# Patient Record
Sex: Female | Born: 1950
Health system: Southern US, Community
[De-identification: ages and names within clinical notes are randomized; demographics above are authoritative.]

## PROBLEM LIST (undated history)

## (undated) DIAGNOSIS — R569 Unspecified convulsions: Secondary | ICD-10-CM

## (undated) DIAGNOSIS — T4145XA Adverse effect of unspecified anesthetic, initial encounter: Secondary | ICD-10-CM

## (undated) DIAGNOSIS — M1990 Unspecified osteoarthritis, unspecified site: Secondary | ICD-10-CM

## (undated) DIAGNOSIS — G44209 Tension-type headache, unspecified, not intractable: Secondary | ICD-10-CM

## (undated) DIAGNOSIS — Z800 Family history of malignant neoplasm of digestive organs: Secondary | ICD-10-CM

## (undated) DIAGNOSIS — T8859XA Other complications of anesthesia, initial encounter: Secondary | ICD-10-CM

## (undated) DIAGNOSIS — Z8489 Family history of other specified conditions: Secondary | ICD-10-CM

## (undated) HISTORY — DX: Family history of malignant neoplasm of digestive organs: Z80.0

## (undated) HISTORY — DX: Tension-type headache, unspecified, not intractable: G44.209

## (undated) HISTORY — PX: COLONOSCOPY: SHX174

## (undated) HISTORY — PX: OTHER SURGICAL HISTORY: SHX169

---

## 1989-02-04 HISTORY — PX: ABDOMINAL HYSTERECTOMY: SHX81

## 2003-01-21 ENCOUNTER — Ambulatory Visit (HOSPITAL_COMMUNITY): Admission: RE | Admit: 2003-01-21 | Discharge: 2003-01-21 | Payer: Self-pay | Admitting: *Deleted

## 2008-02-29 ENCOUNTER — Encounter: Admission: RE | Admit: 2008-02-29 | Discharge: 2008-05-29 | Payer: Self-pay | Admitting: Internal Medicine

## 2010-06-22 NOTE — Op Note (Signed)
Ashlee Beasley, Ashlee Beasley                         ACCOUNT NO.:  [id number]   MEDICAL RECORD NO.:  [id number]                   PATIENT TYPE:  AMB   LOCATION:  ENDO                                 FACILITY:  [facility]   PHYSICIAN:  Georgiana Spinner, M.D.                 DATE OF BIRTH:  1950/02/26   DATE OF PROCEDURE:  01/21/2003  DATE OF DISCHARGE:                                 OPERATIVE REPORT   PROCEDURE:  Colonoscopy.   INDICATIONS:  Colon cancer screening.   ANESTHESIA:  1. Demerol 50 mg.  2. Versed 8 mg.   DESCRIPTION OF PROCEDURE:  With the patient mildly sedated in the left  lateral decubitus position, the Olympus videoscopic colonoscope was inserted  in the rectum and passed through a somewhat tortuous sigmoid colon until we  reached the cecum, identified by the ileocecal valve and the appendiceal  orifice, both of which were photographed.  From this point, the colonoscope  was then slowly withdrawn, taking circumferential views of the colonic  mucosa, stopping only then in the rectum which appeared normal on direct and  retroflexed view.  The endoscope was straightened and withdrawn.  The  patient's vital signs and pulse oximeter remained stable.  The patient  tolerated the procedure well without apparent complications.   FINDINGS:  Unremarkable examination.   PLAN:  Repeat examination in possibly five years.                                               Georgiana Spinner, M.D.    GMO/MEDQ  D:  01/21/2003  T:  01/21/2003  Job:  [id number]

## 2013-04-08 NOTE — H&P (Signed)
TOTAL HIP ADMISSION H&P  Patient is admitted for right total hip arthroplasty, anterior approach.  Subjective:  Chief Complaint:    Right hip OA / pain  HPI:      Ashlee Beasley, 63 y.o. female, has a history of pain and functional disability in the right hip(s) due to arthritis and patient has failed non-surgical conservative treatments for greater than 12 weeks to include NSAID's and/or analgesics, corticosteriod injections, use of assistive devices and activity modification. Onset of symptoms was gradual starting 2+ years ago with gradually worsening course since that time.The patient noted past history of SCFE on the right hip(s). Patient currently rates pain in the right hip at 6 out of 10 with activity. Patient has night pain, worsening of pain with activity and weight bearing, trendelenberg gait, pain that interfers with activities of daily living and pain with passive range of motion. Patient has evidence of periarticular osteophytes and joint space narrowing by imaging studies. This condition presents safety issues increasing the risk of falls. There is no current active infection. Risks, benefits and expectations were discussed with the patient. Risks including but not limited to the risk of anesthesia, blood clots, nerve damage, blood vessel damage, failure of the prosthesis, infection and up to and including death. Patient understand the risks, benefits and expectations and wishes to proceed with surgery.   D/C Plans: Home with HHPT   Post-op Meds: No Rx given   Tranexamic Acid: To be given   Decadron: To be given   FYI:  ASA post-op   Norco post-op    Past Medical History  Diagnosis Date  . Seizures 1978 or 1979     had 2 seizures, no meds since 1982  . Complication of anesthesia   . Arthritis     oa  . Family history of anesthesia complication     mother n/v, blood clot x 1  few daysd after surgery    Past Surgical History  Procedure Laterality Date  . Abdominal  hysterectomy  1991  . Colonscopy  over 10 yrs ago     Allergies  Allergen Reactions  . Phenobarbital Rash        History  Substance Use Topics  . Smoking status: Former Smoker -- 0.50 packs/day for 10 years    Types: Cigarettes    Quit date: 02/04/1998  . Smokeless tobacco: Never Used  . Alcohol Use: Yes     Comment: occasional beer     Review of Systems  Constitutional: Negative.   HENT: Negative.   Eyes: Negative.   Respiratory: Negative.   Cardiovascular: Negative.   Gastrointestinal: Negative.   Genitourinary: Negative.   Musculoskeletal: Positive for joint pain.  Skin: Negative.   Neurological: Negative.   Endo/Heme/Allergies: Negative.   Psychiatric/Behavioral: Negative.     Objective:  Physical Exam  Constitutional: She is oriented to person, place, and time. She appears well-developed and well-nourished.  HENT:  Head: Normocephalic and atraumatic.  Mouth/Throat: Oropharynx is clear and moist.  Eyes: Pupils are equal, round, and reactive to light.  Neck: Neck supple. No JVD present. No tracheal deviation present. No thyromegaly present.  Cardiovascular: Normal rate, regular rhythm, normal heart sounds and intact distal pulses.   Respiratory: Effort normal and breath sounds normal. No stridor. No respiratory distress. She has no wheezes.  GI: Soft. There is no tenderness. There is no guarding.  Musculoskeletal:       Right hip: She exhibits decreased range of motion, decreased strength, tenderness and bony  tenderness. She exhibits no swelling, no deformity and no laceration.  Lymphadenopathy:    She has no cervical adenopathy.  Neurological: She is alert and oriented to person, place, and time.  Skin: Skin is warm and dry.  Psychiatric: She has a normal mood and affect.      Imaging Review Plain radiographs demonstrate severe degenerative joint disease of the right hip(s). The bone quality appears to be good for age and reported activity  level.  Assessment/Plan:  End stage arthritis, right hip(s)  The patient history, physical examination, clinical judgement of the provider and imaging studies are consistent with end stage degenerative joint disease of the right hip(s) and total hip arthroplasty is deemed medically necessary. The treatment options including medical management, injection therapy, arthroscopy and arthroplasty were discussed at length. The risks and benefits of total hip arthroplasty were presented and reviewed. The risks due to aseptic loosening, infection, stiffness, dislocation/subluxation,  thromboembolic complications and other imponderables were discussed.  The patient acknowledged the explanation, agreed to proceed with the plan and consent was signed. Patient is being admitted for inpatient treatment for surgery, pain control, PT, OT, prophylactic antibiotics, VTE prophylaxis, progressive ambulation and ADL's and discharge planning.The patient is planning to be discharged home with home health services.     [place] Pugh Kateryn Marasigan   PAC  04/08/2013, 3:08 PM

## 2013-04-13 ENCOUNTER — Other Ambulatory Visit (HOSPITAL_COMMUNITY): Payer: Self-pay | Admitting: Orthopedic Surgery

## 2013-04-13 ENCOUNTER — Encounter (HOSPITAL_COMMUNITY): Payer: Self-pay | Admitting: Pharmacy Technician

## 2013-04-13 NOTE — Progress Notes (Signed)
Medical clearance note dr Loney Loh on chart

## 2013-04-13 NOTE — Patient Instructions (Addendum)
[place]  04/13/2013   Your procedure is scheduled on: Tuesday March 17th  Report to [place] at  515 AM.  Call this number if you have problems the morning of surgery [phone]   Remember: short stay will be drawing your blood type morning of surgery.  Do not eat food or drink liquids :After Midnight.     Take these medicines the morning of surgery with A SIP OF WATER: NO MEDS TO TAKE                                SEE [place] PREPARING FOR SURGERY SHEET             You may not have any metal on your body including hair pins and piercings  Do not wear jewelry, make-up.  Do not wear lotions, powders, or perfumes. No  Deodorant is to be worn.   Men may shave face and neck.  Do not bring valuables to the hospital. [place].  Contacts, dentures or bridgework may not be worn into surgery.  Leave suitcase in the car. After surgery it may be brought to your room.  For patients admitted to the hospital, checkout time is 11:00 AM the day of discharge.    Please read over the following fact sheets that you were given: [facility] Preparing for surgery sheet,  incentive spirometer sheet, blood fact sheet, MRSA information  Call Zelphia Cairo RN pre op nurse if needed [phone]    [place].  PATIENT SIGNATURE___________________________________________  NURSE SIGNATURE_____________________________________________

## 2013-04-15 ENCOUNTER — Encounter (HOSPITAL_COMMUNITY)
Admission: RE | Admit: 2013-04-15 | Discharge: 2013-04-15 | Disposition: A | Discharge status: Home / Self Care | Payer: BC Managed Care – PPO | Source: Ambulatory Visit | Attending: Orthopedic Surgery | Admitting: Orthopedic Surgery

## 2013-04-15 ENCOUNTER — Encounter (INDEPENDENT_AMBULATORY_CARE_PROVIDER_SITE_OTHER): Payer: Self-pay

## 2013-04-15 ENCOUNTER — Encounter (HOSPITAL_COMMUNITY): Payer: Self-pay

## 2013-04-15 DIAGNOSIS — Z01812 Encounter for preprocedural laboratory examination: Secondary | ICD-10-CM | POA: Insufficient documentation

## 2013-04-15 HISTORY — DX: Adverse effect of unspecified anesthetic, initial encounter: T41.45XA

## 2013-04-15 HISTORY — DX: Family history of other specified conditions: Z84.89

## 2013-04-15 HISTORY — DX: Other complications of anesthesia, initial encounter: T88.59XA

## 2013-04-15 HISTORY — DX: Unspecified osteoarthritis, unspecified site: M19.90

## 2013-04-15 HISTORY — DX: Unspecified convulsions: R56.9

## 2013-04-15 LAB — SURGICAL PCR SCREEN
MRSA, PCR: NEGATIVE
Staphylococcus aureus: NEGATIVE

## 2013-04-15 LAB — CBC
HEMATOCRIT: 42.1 % (ref 36.0–46.0)
Hemoglobin: 14.5 g/dL (ref 12.0–15.0)
MCH: 29.8 pg (ref 26.0–34.0)
MCHC: 34.4 g/dL (ref 30.0–36.0)
MCV: 86.6 fL (ref 78.0–100.0)
PLATELETS: 220 10*3/uL (ref 150–400)
RBC: 4.86 MIL/uL (ref 3.87–5.11)
RDW: 12.7 % (ref 11.5–15.5)
WBC: 5.8 10*3/uL (ref 4.0–10.5)

## 2013-04-15 LAB — BASIC METABOLIC PANEL
BUN: 17 mg/dL (ref 6–23)
CHLORIDE: 101 meq/L (ref 96–112)
CO2: 27 mEq/L (ref 19–32)
Calcium: 9.6 mg/dL (ref 8.4–10.5)
Creatinine, Ser: 0.73 mg/dL (ref 0.50–1.10)
GFR calc non Af Amer: >90 mL/min (ref >90)
Glucose, Bld: 95 mg/dL (ref 70–99)
Potassium: 4.5 mEq/L (ref 3.7–5.3)
Sodium: 139 mEq/L (ref 137–147)

## 2013-04-15 LAB — URINALYSIS, ROUTINE W REFLEX MICROSCOPIC
BILIRUBIN URINE: NEGATIVE
Glucose, UA: NEGATIVE mg/dL
KETONES UR: NEGATIVE mg/dL
LEUKOCYTES UA: NEGATIVE
NITRITE: NEGATIVE
PROTEIN: NEGATIVE mg/dL
Specific Gravity, Urine: 1.005 (ref 1.005–1.030)
UROBILINOGEN UA: 0.2 mg/dL (ref 0.0–1.0)
pH: 5.5 (ref 5.0–8.0)

## 2013-04-15 LAB — APTT: APTT: 26 s (ref 24–37)

## 2013-04-15 LAB — URINE MICROSCOPIC-ADD ON

## 2013-04-15 LAB — PROTIME-INR
INR: 0.98 (ref 0.00–1.49)
Prothrombin Time: 12.8 seconds (ref 11.6–15.2)

## 2013-04-19 NOTE — Anesthesia Preprocedure Evaluation (Addendum)
Anesthesia Evaluation  Patient identified by MRN, date of birth, ID band Patient awake    Reviewed: Allergy & Precautions, H&P , NPO status , Patient's Chart, lab work & pertinent test results  Airway Mallampati: II TM Distance: >3 FB Neck ROM: Full    Dental no notable dental hx.    Pulmonary neg pulmonary ROS, former smoker,  breath sounds clear to auscultation  Pulmonary exam normal       Cardiovascular negative cardio ROS  Rhythm:Regular Rate:Normal     Neuro/Psych negative neurological ROS  negative psych ROS   GI/Hepatic negative GI ROS, Neg liver ROS,   Endo/Other  negative endocrine ROS  Renal/GU negative Renal ROS  negative genitourinary   Musculoskeletal negative musculoskeletal ROS (+)   Abdominal   Peds negative pediatric ROS (+)  Hematology negative hematology ROS (+)   Anesthesia Other Findings   Reproductive/Obstetrics negative OB ROS                         Anesthesia Physical Anesthesia Plan  ASA: II  Anesthesia Plan: Spinal   Post-op Pain Management:    Induction: Intravenous  Airway Management Planned: Simple Face Mask  Additional Equipment:   Intra-op Plan:   Post-operative Plan:   Informed Consent: I have reviewed the patients History and Physical, chart, labs and discussed the procedure including the risks, benefits and alternatives for the proposed anesthesia with the patient or authorized representative who has indicated his/her understanding and acceptance.     Plan Discussed with: CRNA and Surgeon  Anesthesia Plan Comments:         Anesthesia Quick Evaluation

## 2013-04-20 ENCOUNTER — Inpatient Hospital Stay (HOSPITAL_COMMUNITY): Payer: BC Managed Care – PPO

## 2013-04-20 ENCOUNTER — Encounter (HOSPITAL_COMMUNITY): Payer: Self-pay

## 2013-04-20 ENCOUNTER — Inpatient Hospital Stay (HOSPITAL_COMMUNITY): Admission: RE | Admit: 2013-04-20 | Payer: Self-pay | Source: Ambulatory Visit | Admitting: Orthopedic Surgery

## 2013-04-20 ENCOUNTER — Encounter (HOSPITAL_COMMUNITY): Admission: RE | Payer: Self-pay | Source: Ambulatory Visit

## 2013-04-20 ENCOUNTER — Encounter (HOSPITAL_COMMUNITY)
Admission: RE | Disposition: A | Discharge status: Home Health | Payer: Self-pay | Source: Ambulatory Visit | Attending: Orthopedic Surgery

## 2013-04-20 ENCOUNTER — Encounter (HOSPITAL_COMMUNITY): Payer: BC Managed Care – PPO | Admitting: Anesthesiology

## 2013-04-20 ENCOUNTER — Inpatient Hospital Stay (HOSPITAL_COMMUNITY)
Admission: RE | Admit: 2013-04-20 | Discharge: 2013-04-21 | DRG: 470 | Disposition: A | Discharge status: Home Health | Payer: BC Managed Care – PPO | Source: Ambulatory Visit | Attending: Orthopedic Surgery | Admitting: Orthopedic Surgery

## 2013-04-20 ENCOUNTER — Inpatient Hospital Stay (HOSPITAL_COMMUNITY): Payer: BC Managed Care – PPO | Admitting: Anesthesiology

## 2013-04-20 DIAGNOSIS — D62 Acute posthemorrhagic anemia: Secondary | ICD-10-CM | POA: Diagnosis not present

## 2013-04-20 DIAGNOSIS — Z87891 Personal history of nicotine dependence: Secondary | ICD-10-CM

## 2013-04-20 DIAGNOSIS — M169 Osteoarthritis of hip, unspecified: Principal | ICD-10-CM | POA: Diagnosis present

## 2013-04-20 DIAGNOSIS — Z96649 Presence of unspecified artificial hip joint: Secondary | ICD-10-CM

## 2013-04-20 DIAGNOSIS — Q6589 Other specified congenital deformities of hip: Secondary | ICD-10-CM

## 2013-04-20 DIAGNOSIS — M1610 Unilateral primary osteoarthritis, unspecified hip: Principal | ICD-10-CM | POA: Diagnosis present

## 2013-04-20 DIAGNOSIS — D500 Iron deficiency anemia secondary to blood loss (chronic): Secondary | ICD-10-CM | POA: Diagnosis not present

## 2013-04-20 HISTORY — PX: TOTAL HIP ARTHROPLASTY: SHX124

## 2013-04-20 LAB — TYPE AND SCREEN
ABO/RH(D): B POS
ANTIBODY SCREEN: NEGATIVE

## 2013-04-20 LAB — ABO/RH: ABO/RH(D): B POS

## 2013-04-20 SURGERY — ARTHROPLASTY, HIP, TOTAL, ANTERIOR APPROACH
Anesthesia: Spinal | Site: Hip | Laterality: Right

## 2013-04-20 MED ORDER — ALUM & MAG HYDROXIDE-SIMETH 200-200-20 MG/5ML PO SUSP: 30.0000 mL | ORAL | Status: DC | PRN

## 2013-04-20 MED ORDER — MIDAZOLAM HCL 5 MG/5ML IJ SOLN
INTRAMUSCULAR | Status: DC | PRN
  Administered 2013-04-20: 2 mg via INTRAVENOUS

## 2013-04-20 MED ORDER — DEXAMETHASONE SODIUM PHOSPHATE 10 MG/ML IJ SOLN
10.0000 mg | Freq: Once | INTRAMUSCULAR | Status: AC
  Administered 2013-04-20: 10 mg via INTRAVENOUS

## 2013-04-20 MED ORDER — POLYETHYLENE GLYCOL 3350 17 G PO PACK: 17.0000 g | PACK | Freq: Every day | ORAL | Status: DC | PRN

## 2013-04-20 MED ORDER — ONDANSETRON HCL 4 MG/2ML IJ SOLN
4.0000 mg | Freq: Four times a day (QID) | INTRAMUSCULAR | Status: DC | PRN
  Administered 2013-04-20 – 2013-04-21 (×2): 4 mg via INTRAVENOUS
  Filled 2013-04-20 (×2): qty 2

## 2013-04-20 MED ORDER — EPHEDRINE SULFATE 50 MG/ML IJ SOLN
INTRAMUSCULAR | Status: DC | PRN
  Administered 2013-04-20 (×3): 5 mg via INTRAVENOUS

## 2013-04-20 MED ORDER — TRANEXAMIC ACID 100 MG/ML IV SOLN
1000.0000 mg | Freq: Once | INTRAVENOUS | Status: AC
  Administered 2013-04-20: 1000 mg via INTRAVENOUS
  Filled 2013-04-20: qty 10

## 2013-04-20 MED ORDER — FERROUS SULFATE 325 (65 FE) MG PO TABS
325.0000 mg | ORAL_TABLET | Freq: Three times a day (TID) | ORAL | Status: DC
  Administered 2013-04-20 – 2013-04-21 (×2): 325 mg via ORAL
  Filled 2013-04-20 (×5): qty 1

## 2013-04-20 MED ORDER — CEFAZOLIN SODIUM-DEXTROSE 2-3 GM-% IV SOLR
INTRAVENOUS | Status: AC
  Filled 2013-04-20: qty 50

## 2013-04-20 MED ORDER — HYDROCODONE-ACETAMINOPHEN 7.5-325 MG PO TABS
1.0000 | ORAL_TABLET | ORAL | Status: DC
  Administered 2013-04-20 (×3): 2 via ORAL
  Administered 2013-04-20 – 2013-04-21 (×2): 1 via ORAL
  Administered 2013-04-21 (×2): 2 via ORAL
  Filled 2013-04-20: qty 2
  Filled 2013-04-20: qty 1
  Filled 2013-04-20 (×5): qty 2

## 2013-04-20 MED ORDER — PROPOFOL 10 MG/ML IV BOLUS
INTRAVENOUS | Status: AC
  Filled 2013-04-20: qty 20

## 2013-04-20 MED ORDER — PROMETHAZINE HCL 25 MG/ML IJ SOLN: 6.2500 mg | INTRAMUSCULAR | Status: DC | PRN

## 2013-04-20 MED ORDER — HYDROMORPHONE HCL PF 1 MG/ML IJ SOLN: 0.2500 mg | INTRAMUSCULAR | Status: DC | PRN

## 2013-04-20 MED ORDER — MENTHOL 3 MG MT LOZG: 1.0000 | LOZENGE | OROMUCOSAL | Status: DC | PRN

## 2013-04-20 MED ORDER — DEXTROSE 5 % IV SOLN
500.0000 mg | Freq: Four times a day (QID) | INTRAVENOUS | Status: DC | PRN
  Administered 2013-04-20: 500 mg via INTRAVENOUS
  Filled 2013-04-20: qty 5

## 2013-04-20 MED ORDER — SODIUM CHLORIDE 0.9 % IJ SOLN
INTRAMUSCULAR | Status: AC
  Filled 2013-04-20: qty 10

## 2013-04-20 MED ORDER — FENTANYL CITRATE 0.05 MG/ML IJ SOLN
INTRAMUSCULAR | Status: AC
  Filled 2013-04-20: qty 2

## 2013-04-20 MED ORDER — ONDANSETRON HCL 4 MG/2ML IJ SOLN
INTRAMUSCULAR | Status: AC
  Filled 2013-04-20: qty 2

## 2013-04-20 MED ORDER — 0.9 % SODIUM CHLORIDE (POUR BTL) OPTIME
TOPICAL | Status: DC | PRN
  Administered 2013-04-20: 1000 mL

## 2013-04-20 MED ORDER — EPHEDRINE SULFATE 50 MG/ML IJ SOLN
INTRAMUSCULAR | Status: AC
  Filled 2013-04-20: qty 1

## 2013-04-20 MED ORDER — LIDOCAINE HCL (CARDIAC) 20 MG/ML IV SOLN
INTRAVENOUS | Status: DC | PRN
  Administered 2013-04-20: 100 mg via INTRAVENOUS

## 2013-04-20 MED ORDER — ASPIRIN EC 325 MG PO TBEC
325.0000 mg | DELAYED_RELEASE_TABLET | Freq: Two times a day (BID) | ORAL | Status: DC
  Administered 2013-04-20 – 2013-04-21 (×2): 325 mg via ORAL
  Filled 2013-04-20 (×4): qty 1

## 2013-04-20 MED ORDER — PHENYLEPHRINE 40 MCG/ML (10ML) SYRINGE FOR IV PUSH (FOR BLOOD PRESSURE SUPPORT)
PREFILLED_SYRINGE | INTRAVENOUS | Status: AC
  Filled 2013-04-20: qty 10

## 2013-04-20 MED ORDER — SODIUM CHLORIDE 0.9 % IV SOLN
INTRAVENOUS | Status: DC
  Administered 2013-04-20 (×2): via INTRAVENOUS
  Filled 2013-04-20 (×8): qty 1000

## 2013-04-20 MED ORDER — FENTANYL CITRATE 0.05 MG/ML IJ SOLN
INTRAMUSCULAR | Status: DC | PRN
  Administered 2013-04-20: 50 ug via INTRAVENOUS

## 2013-04-20 MED ORDER — HYDROMORPHONE HCL PF 1 MG/ML IJ SOLN
0.2000 mg | INTRAMUSCULAR | Status: DC | PRN
  Administered 2013-04-20: 0.5 mg via INTRAVENOUS
  Filled 2013-04-20: qty 1

## 2013-04-20 MED ORDER — SENNA 8.6 MG PO TABS
1.0000 | ORAL_TABLET | Freq: Two times a day (BID) | ORAL | Status: DC
  Administered 2013-04-20 – 2013-04-21 (×3): 8.6 mg via ORAL

## 2013-04-20 MED ORDER — DIPHENHYDRAMINE HCL 12.5 MG/5ML PO ELIX: 25.0000 mg | ORAL_SOLUTION | Freq: Four times a day (QID) | ORAL | Status: DC | PRN

## 2013-04-20 MED ORDER — METHOCARBAMOL 500 MG PO TABS
500.0000 mg | ORAL_TABLET | Freq: Four times a day (QID) | ORAL | Status: DC | PRN
  Administered 2013-04-20 – 2013-04-21 (×2): 500 mg via ORAL
  Filled 2013-04-20 (×2): qty 1

## 2013-04-20 MED ORDER — HYDROMORPHONE HCL PF 1 MG/ML IJ SOLN
0.5000 mg | INTRAMUSCULAR | Status: DC | PRN
Start: 2013-04-20 — End: 2013-04-21
  Administered 2013-04-20: 0.5 mg via INTRAVENOUS
  Administered 2013-04-20: 1 mg via INTRAVENOUS
  Filled 2013-04-20 (×2): qty 1

## 2013-04-20 MED ORDER — CEFAZOLIN SODIUM 1-5 GM-% IV SOLN
1.0000 g | Freq: Four times a day (QID) | INTRAVENOUS | Status: AC
  Administered 2013-04-20 (×2): 1 g via INTRAVENOUS
  Filled 2013-04-20 (×2): qty 50

## 2013-04-20 MED ORDER — PHENYLEPHRINE HCL 10 MG/ML IJ SOLN
INTRAMUSCULAR | Status: DC | PRN
  Administered 2013-04-20 (×2): 80 ug via INTRAVENOUS
  Administered 2013-04-20: 40 ug via INTRAVENOUS
  Administered 2013-04-20: 80 ug via INTRAVENOUS
  Administered 2013-04-20: 40 ug via INTRAVENOUS
  Administered 2013-04-20: 80 ug via INTRAVENOUS

## 2013-04-20 MED ORDER — LACTATED RINGERS IV SOLN
INTRAVENOUS | Status: DC | PRN
  Administered 2013-04-20: 07:00:00 via INTRAVENOUS

## 2013-04-20 MED ORDER — ONDANSETRON HCL 4 MG/2ML IJ SOLN
INTRAMUSCULAR | Status: DC | PRN
  Administered 2013-04-20: 4 mg via INTRAVENOUS

## 2013-04-20 MED ORDER — DEXAMETHASONE SODIUM PHOSPHATE 10 MG/ML IJ SOLN
10.0000 mg | Freq: Once | INTRAMUSCULAR | Status: AC
  Administered 2013-04-21: 10 mg via INTRAVENOUS
  Filled 2013-04-20 (×2): qty 1

## 2013-04-20 MED ORDER — MIDAZOLAM HCL 2 MG/2ML IJ SOLN
INTRAMUSCULAR | Status: AC
  Filled 2013-04-20: qty 2

## 2013-04-20 MED ORDER — LIDOCAINE HCL (CARDIAC) 20 MG/ML IV SOLN
INTRAVENOUS | Status: AC
  Filled 2013-04-20: qty 5

## 2013-04-20 MED ORDER — DOCUSATE SODIUM 100 MG PO CAPS
100.0000 mg | ORAL_CAPSULE | Freq: Two times a day (BID) | ORAL | Status: DC
  Administered 2013-04-20 – 2013-04-21 (×2): 100 mg via ORAL

## 2013-04-20 MED ORDER — CEFAZOLIN SODIUM-DEXTROSE 2-3 GM-% IV SOLR
2.0000 g | INTRAVENOUS | Status: AC
  Administered 2013-04-20: 2 g via INTRAVENOUS

## 2013-04-20 MED ORDER — ONDANSETRON HCL 4 MG PO TABS: 4.0000 mg | ORAL_TABLET | Freq: Four times a day (QID) | ORAL | Status: DC | PRN

## 2013-04-20 MED ORDER — BUPIVACAINE IN DEXTROSE 0.75-8.25 % IT SOLN
INTRATHECAL | Status: DC | PRN
  Administered 2013-04-20: 2 mL via INTRATHECAL

## 2013-04-20 MED ORDER — PROPOFOL INFUSION 10 MG/ML OPTIME
INTRAVENOUS | Status: DC | PRN
  Administered 2013-04-20: 75 ug/kg/min via INTRAVENOUS

## 2013-04-20 MED ORDER — PHENOL 1.4 % MT LIQD: 1.0000 | OROMUCOSAL | Status: DC | PRN

## 2013-04-20 SURGICAL SUPPLY — 37 items
BAG ZIPLOCK 12X15 (MISCELLANEOUS) IMPLANT
BLADE SAW SGTL 18X1.27X75 (BLADE) ×2 IMPLANT
CAPT HIP PF COP ×2 IMPLANT
DERMABOND ADVANCED (GAUZE/BANDAGES/DRESSINGS) ×1
DERMABOND ADVANCED .7 DNX12 (GAUZE/BANDAGES/DRESSINGS) ×1 IMPLANT
DRAPE C-ARM 42X120 X-RAY (DRAPES) ×2 IMPLANT
DRAPE STERI IOBAN 125X83 (DRAPES) ×2 IMPLANT
DRAPE U-SHAPE 47X51 STRL (DRAPES) ×6 IMPLANT
DRSG AQUACEL AG ADV 3.5X10 (GAUZE/BANDAGES/DRESSINGS) ×2 IMPLANT
DRSG TEGADERM 4X4.75 (GAUZE/BANDAGES/DRESSINGS) IMPLANT
DURAPREP 26ML APPLICATOR (WOUND CARE) ×2 IMPLANT
ELECT BLADE TIP CTD 4 INCH (ELECTRODE) ×2 IMPLANT
ELECT REM PT RETURN 9FT ADLT (ELECTROSURGICAL) ×2
ELECTRODE REM PT RTRN 9FT ADLT (ELECTROSURGICAL) ×1 IMPLANT
EVACUATOR 1/8 PVC DRAIN (DRAIN) IMPLANT
FACESHIELD LNG OPTICON STERILE (SAFETY) ×8 IMPLANT
GAUZE SPONGE 2X2 8PLY STRL LF (GAUZE/BANDAGES/DRESSINGS) IMPLANT
GLOVE BIOGEL PI IND STRL 7.5 (GLOVE) ×1 IMPLANT
GLOVE BIOGEL PI IND STRL 8 (GLOVE) ×1 IMPLANT
GLOVE BIOGEL PI INDICATOR 7.5 (GLOVE) ×1
GLOVE BIOGEL PI INDICATOR 8 (GLOVE) ×1
GLOVE ECLIPSE 8.0 STRL XLNG CF (GLOVE) ×2 IMPLANT
GLOVE ORTHO TXT STRL SZ7.5 (GLOVE) ×4 IMPLANT
GOWN SPEC L3 XXLG W/TWL (GOWN DISPOSABLE) ×2 IMPLANT
GOWN STRL REUS W/TWL LRG LVL3 (GOWN DISPOSABLE) ×2 IMPLANT
KIT BASIN OR (CUSTOM PROCEDURE TRAY) ×2 IMPLANT
PACK TOTAL JOINT (CUSTOM PROCEDURE TRAY) ×2 IMPLANT
PADDING CAST COTTON 6X4 STRL (CAST SUPPLIES) ×2 IMPLANT
SPONGE GAUZE 2X2 STER 10/PKG (GAUZE/BANDAGES/DRESSINGS)
SUT MNCRL AB 4-0 PS2 18 (SUTURE) ×2 IMPLANT
SUT VIC AB 1 CT1 36 (SUTURE) ×6 IMPLANT
SUT VIC AB 2-0 CT1 27 (SUTURE) ×2
SUT VIC AB 2-0 CT1 TAPERPNT 27 (SUTURE) ×2 IMPLANT
SUT VLOC 180 0 24IN GS25 (SUTURE) ×2 IMPLANT
TOWEL OR 17X26 10 PK STRL BLUE (TOWEL DISPOSABLE) ×2 IMPLANT
TOWEL OR NON WOVEN STRL DISP B (DISPOSABLE) IMPLANT
TRAY FOLEY CATH 14FRSI W/METER (CATHETERS) ×2 IMPLANT

## 2013-04-20 NOTE — Progress Notes (Signed)
   CARE MANAGEMENT NOTE 04/20/2013  Patient:  JERZY, CROTTEAU   Account Number:  [id number]  Date Initiated:  04/20/2013  Documentation initiated by:  [facility]  Subjective/Objective Assessment:   RIGHT TOTAL HIP ARTHROPLASTY ANTERIOR APPROACH     Action/Plan:   HH   Anticipated DC Date:  04/22/2013   Anticipated DC Plan:  [place]  CM consult      [facility] Choice  HOME HEALTH   Choice offered to / List presented to:  C-1 Patient        [place] arranged  HH-2 PT      [place]   Status of service:  Completed, signed off Medicare Important Message given?   (If response is "NO", the following Medicare IM given date fields will be blank) Date Medicare IM given:   Date Additional Medicare IM given:    Discharge Disposition:  [place]  Per UR Regulation:    If discussed at Long Length of Stay Meetings, dates discussed:    Comments:  04/20/2013 1400 NCM spoke to pt and offered choice for [facility]. Pt agreeable to [facility]  for [facility]. States she has RW and 3n1 for home. Jonnie Finner RN CCM Case Mgmt phone [phone]

## 2013-04-20 NOTE — Op Note (Signed)
NAME:  Ashlee DecentDeborah B Milich                ACCOUNT NO.: [id number]      MEDICAL RECORD NO.: [id number]      [facility]      PHYSICIAN:  Durene RomansLIN,Laural Eiland D  DATE OF BIRTH:  Nov 20, 1950     DATE OF PROCEDURE:  04/20/2013                                 OPERATIVE REPORT         PREOPERATIVE DIAGNOSIS: Right  hip osteoarthritis secondary to hip dysplasia     POSTOPERATIVE DIAGNOSIS:  Right hip osteoarthritis secondary to hip dysplasia     PROCEDURE:  Right total hip replacement through an anterior approach   utilizing DePuy THR system, component size 52mm pinnacle cup, a size 36+4 neutral   Altrex liner, a size 8 Hi Tri Lock stem with a 36+1.5 delta ceramic   ball.      SURGEON:  Madlyn FrankelMatthew D. Charlann Boxerlin, M.D.      ASSISTANT:  Leilani AbleSteve Chabon, PA-C      ANESTHESIA:  Spinal.      SPECIMENS:  None.      COMPLICATIONS:  None.      BLOOD LOSS:  350 cc     DRAINS:  None.      INDICATION OF THE PROCEDURE:  Ashlee Beasley is a 63 y.o. female who had   presented to office for evaluation of right hip pain.  Radiographs revealed   progressive degenerative changes with bone-on-bone   articulation to the  hip joint.  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  The patient had painful limited range of   motion significantly affecting their overall quality of life.  The patient was failing to    respond to conservative measures, and at this point was ready   to proceed with more definitive measures.  The patient has noted progressive   degenerative changes in his hip, progressive problems and dysfunction   with regarding the hip prior to surgery.  Consent was obtained for   benefit of pain relief.  Specific risk of infection, DVT, component   failure, dislocation, need for revision surgery, as well discussion of   the anterior versus posterior approach were reviewed.  Consent was   obtained for benefit of anterior pain relief through an  anterior   approach.      PROCEDURE IN DETAIL:  The patient was brought to operative theater.   Once adequate anesthesia, preoperative antibiotics, 2gm Ancef administered.   The patient was positioned supine on the OSI Hanna table.  Once adequate   padding of boney process was carried out, we had predraped out the hip, and  used fluoroscopy to confirm orientation of the pelvis and position.      The right hip was then prepped and draped from proximal iliac crest to   mid thigh with shower curtain technique.      Time-out was performed identifying the patient, planned procedure, and   extremity.     An incision was then made 2 cm distal and lateral to the   anterior superior iliac spine extending over the orientation of the   tensor fascia lata muscle and sharp dissection was carried down to the   fascia of the muscle and protractor placed in  the soft tissues.      The fascia was then incised.  The muscle belly was identified and swept   laterally and retractor placed along the superior neck.  Following   cauterization of the circumflex vessels and removing some pericapsular   fat, a second cobra retractor was placed on the inferior neck.  A third   retractor was placed on the anterior acetabulum after elevating the   anterior rectus.  A L-capsulotomy was along the line of the   superior neck to the trochanteric fossa, then extended proximally and   distally.  Tag sutures were placed and the retractors were then placed   intracapsular.  We then identified the trochanteric fossa and   orientation of my neck cut, confirmed this radiographically   and then made a neck osteotomy with the femur on traction.  The femoral   head was removed without difficulty or complication.  Traction was let   off and retractors were placed posterior and anterior around the   acetabulum.      The labrum and foveal tissue were debrided.  I began reaming with a 1mm   reamer and reamed up to 59mm reamer  with good bony bed preparation and a 52   cup was chosen.  The final 48mm Pinnacle cup was then impacted under fluoroscopy  to confirm the depth of penetration and orientation with respect to   abduction.  A screw was placed followed by the hole eliminator.  The final   36+4 neutral Altrex liner was impacted with good visualized rim fit.  The cup was positioned anatomically within the acetabular portion of the pelvis.      At this point, the femur was rolled at 80 degrees.  Further capsule was   released off the inferior aspect of the femoral neck.  I then   released the superior capsule proximally.  The hook was placed laterally   along the femur and elevated manually and held in position with the bed   hook.  The leg was then extended and adducted with the leg rolled to 100   degrees of external rotation.  Once the proximal femur was fully   exposed, I used a box osteotome to set orientation.  I then began   broaching with the starting chili pepper broach and passed this by hand and then broached up to 8.  With the 8 broach in place I chose a high offset neck and did a trial reduction.  The offset was appropriate, leg lengths   appeared to be equal, confirmed radiographically.   Given these findings, I went ahead and dislocated the hip, repositioned all   retractors and positioned the right hip in the extended and abducted position.  The final 8 Hi Tri Lock stem was   chosen and it was impacted down to the level of neck cut.  Based on this   and the trial reduction, a 36+1.5 delta ceramic ball was chosen and   impacted onto a clean and dry trunnion, and the hip was reduced.  The   hip had been irrigated throughout the case again at this point.  The fascia of the   tensor fascia lata muscle was then reapproximated using #1 Vicryl and #0 V-lock sutures.  The   remaining wound was closed with 2-0 Vicryl and running 4-0 Monocryl.   The hip was cleaned, dried, and dressed sterilely using  Dermabond and   Aquacel dressing.  She was then brought  to recovery room in stable condition tolerating the procedure well.    Molli Barrows, PA-C was present for the entirety of the case involved from   preoperative positioning, perioperative retractor management, general   facilitation of the case, as well as primary wound closure as assistant.            Pietro Cassis Alvan Dame, M.D.        04/20/2013 9:01 AM

## 2013-04-20 NOTE — Transfer of Care (Signed)
Immediate Anesthesia Transfer of Care Note  Patient: Ashlee Beasley  Procedure(s) Performed: Procedure(s): RIGHT TOTAL HIP ARTHROPLASTY ANTERIOR APPROACH (Right)  Patient Location: PACU  Anesthesia Type:Spinal  Level of Consciousness: awake, alert , oriented and patient cooperative  Airway & Oxygen Therapy: Patient Spontanous Breathing and Patient connected to face mask oxygen  Post-op Assessment: Report given to PACU RN and Post -op Vital signs reviewed and stable  Post vital signs: Reviewed and stable  Complications: No apparent anesthesia complications

## 2013-04-20 NOTE — Anesthesia Procedure Notes (Signed)

## 2013-04-20 NOTE — Evaluation (Addendum)
Physical Therapy Evaluation Patient Details Name: Ashlee Beasley MRN: [id number] DOB: [birth year] Today's Date: 04/20/2013 Time: 2409-7353 PT Time Calculation (min): 31 min  PT Assessment / Plan / Recommendation History of Present Illness     Clinical Impression  *Pt is s/p THA resulting in the deficits listed below (see PT Problem List). ** Pt will benefit from skilled PT to increase their independence and safety with mobility to allow discharge to the venue listed below.   **   Activity tolerance limited by pt feeling faint in sitting. She stood with RW twice for approximately 15 seconds.  Assisted pt back to bed.   PT Assessment  Patient needs continued PT services    Follow Up Recommendations  Home health PT    Does the patient have the potential to tolerate intense rehabilitation      Barriers to Discharge        Equipment Recommendations  None recommended by PT    Recommendations for Other Services     Frequency 7X/week    Precautions / Restrictions Restrictions Weight Bearing Restrictions: No   Pertinent Vitals/Pain *10/10 R hip pain due to muscle spasms at start of Eval Pt premedicated, ice applied, responded well to ROM and relaxation breathing technique*  BP 91/55 initially in sitting, 115/82 after BUE ROM  SaO2 97% on RA, HR 84*      Mobility  Bed Mobility Overal bed mobility: Needs Assistance Bed Mobility: Supine to Sit;Sit to Supine Supine to sit: Mod assist Sit to supine: Min assist General bed mobility comments: assist to raise trunk, VCs for technique Transfers Overall transfer level: Needs assistance Equipment used: Rolling walker (2 wheeled) Transfers: Sit to/from Stand Sit to Stand: Mod assist General transfer comment: assist to rise Ambulation/Gait General Gait Details: unable to walk, dizzy in standing    Exercises Total Joint Exercises Ankle Circles/Pumps: AROM;Both;Supine;20 reps Short Arc QuadSinclair Ship;Right;5 reps;Supine Heel  Slides: AAROM;Right;10 reps;Supine Hip ABduction/ADduction: AAROM;Right;10 reps;Supine   PT Diagnosis: Difficulty walking;Acute pain  PT Problem List: Decreased strength;Decreased range of motion;Decreased balance;Decreased activity tolerance;Decreased mobility;Pain PT Treatment Interventions: DME instruction;Gait training;Stair training;Functional mobility training;Therapeutic activities;Therapeutic exercise;Patient/family education     PT Goals(Current goals can be found in the care plan section) Acute Rehab PT Goals Patient Stated Goal: to walk, go up and down stairs PT Goal Formulation: With patient/family Time For Goal Achievement: 04/27/13 Potential to Achieve Goals: Good  Visit Information          Prior [place] expects to be discharged to:: Private residence Living Arrangements: Spouse/significant other Available Help at Discharge: Available 24 hours/day;Family Type of Home: House Home Access: Stairs to enter [employer] of Steps: 8 Entrance Stairs-Rails: Left Home Layout: One level Home Equipment: Walker - 2 wheels Prior Function Level of Independence: Independent Communication Communication: No difficulties    Cognition  Cognition Arousal/Alertness: Awake/alert Behavior During Therapy: WFL for tasks assessed/performed;Anxious Overall Cognitive Status: Within Functional Limits for tasks assessed    Extremity/Trunk Assessment Upper Extremity Assessment Upper Extremity Assessment: Overall WFL for tasks assessed Lower Extremity Assessment Lower Extremity Assessment: RLE deficits/detail RLE Deficits / Details: knee ext +2/5 Cervical / Trunk Assessment Cervical / Trunk Assessment: Normal   Balance Balance Overall balance assessment: Needs assistance Sitting balance-Leahy Scale: Fair Standing balance support: Bilateral upper extremity supported Standing balance-Leahy Scale: Poor  End of Session PT - End of  Session Equipment Utilized During Treatment: Gait belt Activity Tolerance: Patient limited by fatigue Patient left: in bed;with call  bell/phone within reach;with family/visitor present Nurse Communication: Mobility status  GP     Ashlee Beasley 04/20/2013, 2:48 PM [phone]

## 2013-04-20 NOTE — Preoperative (Signed)
Beta Blockers   Reason not to administer Beta Blockers:Not Applicable 

## 2013-04-20 NOTE — Interval H&P Note (Signed)
History and Physical Interval Note:  04/20/2013 7:33 AM  Ashlee Beasley  has presented today for surgery, with the diagnosis of RIGHT HIP OSTEOARTHRITIS SECONDARY TO Slipped capital femoral epiphysis  The various methods of treatment have been discussed with the patient and family. After consideration of risks, benefits and other options for treatment, the patient has consented to  Procedure(s): RIGHT TOTAL HIP ARTHROPLASTY ANTERIOR APPROACH (Right) as a surgical intervention .  The patient's history has been reviewed, patient examined, no change in status, stable for surgery.  I have reviewed the patient's chart and labs.  Questions were answered to the patient's satisfaction.     Mauri Pole

## 2013-04-20 NOTE — Anesthesia Postprocedure Evaluation (Signed)
  Anesthesia Post-op Note  Patient: Ashlee Beasley  Procedure(s) Performed: Procedure(s) (LRB): RIGHT TOTAL HIP ARTHROPLASTY ANTERIOR APPROACH (Right)  Patient Location: PACU  Anesthesia Type: Spinal  Level of Consciousness: awake and alert   Airway and Oxygen Therapy: Patient Spontanous Breathing  Post-op Pain: mild  Post-op Assessment: Post-op Vital signs reviewed, Patient's Cardiovascular Status Stable, Respiratory Function Stable, Patent Airway and No signs of Nausea or vomiting  Last Vitals:  Filed Vitals:   04/20/13 0916  BP: 78/57  Pulse: 73  Temp: 36.4 C  Resp: 13    Post-op Vital Signs: stable   Complications: No apparent anesthesia complications

## 2013-04-21 DIAGNOSIS — D500 Iron deficiency anemia secondary to blood loss (chronic): Secondary | ICD-10-CM | POA: Diagnosis not present

## 2013-04-21 LAB — CBC
HCT: 30.2 % — ABNORMAL LOW (ref 36.0–46.0)
Hemoglobin: 10 g/dL — ABNORMAL LOW (ref 12.0–15.0)
MCH: 29.1 pg (ref 26.0–34.0)
MCHC: 33.1 g/dL (ref 30.0–36.0)
MCV: 87.8 fL (ref 78.0–100.0)
Platelets: 153 10*3/uL (ref 150–400)
RBC: 3.44 MIL/uL — AB (ref 3.87–5.11)
RDW: 12.9 % (ref 11.5–15.5)
WBC: 6.5 10*3/uL (ref 4.0–10.5)

## 2013-04-21 LAB — BASIC METABOLIC PANEL
BUN: 12 mg/dL (ref 6–23)
CALCIUM: 8.1 mg/dL — AB (ref 8.4–10.5)
CO2: 27 mEq/L (ref 19–32)
CREATININE: 0.9 mg/dL (ref 0.50–1.10)
Chloride: 103 mEq/L (ref 96–112)
GFR calc Af Amer: 78 mL/min — ABNORMAL LOW (ref >90)
GFR calc non Af Amer: 68 mL/min — ABNORMAL LOW (ref >90)
Glucose, Bld: 114 mg/dL — ABNORMAL HIGH (ref 70–99)
Potassium: 4 mEq/L (ref 3.7–5.3)
Sodium: 137 mEq/L (ref 137–147)

## 2013-04-21 MED ORDER — POLYETHYLENE GLYCOL 3350 17 G PO PACK: 17.0000 g | PACK | Freq: Two times a day (BID) | ORAL | Status: DC

## 2013-04-21 MED ORDER — ASPIRIN 325 MG PO TBEC: 325.0000 mg | DELAYED_RELEASE_TABLET | Freq: Two times a day (BID) | ORAL | Status: AC

## 2013-04-21 MED ORDER — HYDROCODONE-ACETAMINOPHEN 7.5-325 MG PO TABS: 1.0000 | ORAL_TABLET | ORAL | Status: DC | PRN

## 2013-04-21 MED ORDER — DSS 100 MG PO CAPS: 100.0000 mg | ORAL_CAPSULE | Freq: Two times a day (BID) | ORAL | Status: DC

## 2013-04-21 MED ORDER — FERROUS SULFATE 325 (65 FE) MG PO TABS: 325.0000 mg | ORAL_TABLET | Freq: Three times a day (TID) | ORAL | Status: DC

## 2013-04-21 MED ORDER — METHOCARBAMOL 500 MG PO TABS: 500.0000 mg | ORAL_TABLET | Freq: Four times a day (QID) | ORAL | Status: DC | PRN

## 2013-04-21 NOTE — Progress Notes (Signed)
Physical Therapy Treatment Patient Details Name: Ashlee Beasley MRN: [id number] DOB: [birth year] Today's Date: 04/21/2013 Time: 2951-8841 PT Time Calculation (min): 19 min  PT Assessment / Plan / Recommendation  History of Present Illness R DA THA   PT Comments   Pt will benefit from continued PT; she is a fall risk and will need 24hr assist/supervision at home  Follow Up Recommendations  Home health PT;Supervision/Assistance - 24 hour     Does the patient have the potential to tolerate intense rehabilitation     Barriers to Discharge        Equipment Recommendations  None recommended by PT    Recommendations for Other Services    Frequency 7X/week   Progress towards PT Goals Progress towards PT goals: Progressing toward goals  Plan Current plan remains appropriate    Precautions / Restrictions Precautions Precautions: Fall Restrictions Weight Bearing Restrictions: No   Pertinent Vitals/Pain Pain and nausea controlled    Mobility  Bed Mobility Supine to sit: Mod assist General bed mobility comments: assist for RLE and trunk; cues for technique Transfers Overall transfer level: Needs assistance Equipment used: Rolling walker (2 wheeled) Transfers: Sit to/from Stand Sit to Stand: Min assist General transfer comment: multi-modal cues for sequencing; pt with diminished carryover from OT session Ambulation/Gait Ambulation/Gait assistance: Min assist;Mod assist Ambulation Distance (Feet): 60 Feet Assistive device: Rolling walker (2 wheeled) Gait Pattern/deviations: Step-to pattern;Antalgic;Narrow base of support;Decreased weight shift to right (internal rotation  R hip) General Gait Details: requiring multi-modal cues for  sequence, RW distance from self    Exercises Total Joint Exercises Heel Slides: AAROM;Right;10 reps   PT Diagnosis:    PT Problem List:   PT Treatment Interventions:     PT Goals (current goals can now be found in the care plan section) Acute  Rehab PT Goals Patient Stated Goal: walk to bathroom PT Goal Formulation: With patient/family Time For Goal Achievement: 04/27/13 Potential to Achieve Goals: Good  Visit Information  Last PT Received On: 04/21/13 Assistance Needed: +2 History of Present Illness: R DA THA    Subjective Data  Patient Stated Goal: walk to bathroom   Cognition  Cognition Arousal/Alertness: Awake/alert Behavior During Therapy: Anxious Overall Cognitive Status: Impaired/Different from baseline Area of Impairment: Safety/judgement;Following commands;Attention;Problem solving Current Attention Level: Sustained Following Commands: Follows one step commands with increased time;Follows one step commands inconsistently Safety/Judgement: Decreased awareness of safety;Decreased awareness of deficits Problem Solving: Difficulty sequencing;Requires verbal cues;Requires tactile cues General Comments: pt with much difficulty attending to task;      Balance  Balance Overall balance assessment: Needs assistance Sitting balance-Leahy Scale: Fair Standing balance support: Bilateral upper extremity supported;Single extremity supported Standing balance-Leahy Scale: Poor Standing balance comment: pt with LOB in static stand while lifting hand talking; requires assist to prevent fall  End of Session PT - End of Session Activity Tolerance: Patient tolerated treatment well Patient left: in chair;with call bell/phone within reach;with family/visitor present Nurse Communication: Mobility status   GP     [facility] 04/21/2013, 12:42 PM

## 2013-04-21 NOTE — Evaluation (Signed)
Occupational Therapy Evaluation Patient Details Name: Ashlee Beasley MRN: [id number] DOB: 10-31-1950 Today's Date: 04/21/2013 Time: 1740-8144 OT Time Calculation (min): 69 min  OT Assessment / Plan / Recommendation History of present illness R DA THA   Clinical Impression   Pt was seen for initial evaluation.  She was limited by nausea/vomiting.    Pt very anxious about moving and doing something wrong during evaluation.  Difficulty processing when nauseas and safety was an issue.  Will follow in acute to continue education and increase safety and independence with adls.      OT Assessment  Patient needs continued OT Services    Follow Up Recommendations  Home health OT    Barriers to Discharge      Equipment Recommendations  3 in 1 bedside comode    Recommendations for Other Services    Frequency  Min 2X/week    Precautions / Restrictions Precautions Precautions: Fall Restrictions Weight Bearing Restrictions: No   Pertinent Vitals/Pain Nausea/vomiting:  RN brought meds.  BP 122/76 when standing and initially lightheaded.  No pain reported but ice provided for comfort/edema control    ADL  Grooming: Set up;Teeth care;Wash/dry face Where Assessed - Grooming: Unsupported sitting Upper Body Bathing: Set up Where Assessed - Upper Body Bathing: Unsupported sitting Lower Body Bathing: Moderate assistance Where Assessed - Lower Body Bathing: Supported sit to stand Upper Body Dressing: Minimal assistance Where Assessed - Upper Body Dressing: Unsupported sitting Lower Body Dressing: Maximal assistance Where Assessed - Lower Body Dressing: Supported sit to stand Toilet Transfer: Moderate assistance Toilet Transfer Method: Stand pivot Science writer: Bedside commode Toileting - Clothing Manipulation and Hygiene: Minimal assistance Where Assessed - Toileting Clothing Manipulation and Hygiene: Sit to stand from 3-in-1 or toilet Equipment Used: Rolling  walker Transfers/Ambulation Related to ADLs: pt vomited 3x during OT session, twice standing and once sitting.  Pt has difficulty processing cues when nauseas vomiting and +2 is recommended for safety for anything beyond spt.  Pt very anxious about all movement.  Tends to keep hip IR; repositioned in chair ADL Comments: did not introduce AE on this visit.  She will have 24/7.  Explained tub bench, which she is familar with.  She will need 3:1 commode. Educated on tub readiness.  Pt will benefit from tub bench vs. Sponge bathing.   OT Diagnosis: Generalized weakness  OT Problem List: Decreased strength;Decreased activity tolerance;Impaired balance (sitting and/or standing);Decreased safety awareness;Decreased knowledge of use of DME or AE;Pain OT Treatment Interventions: Self-care/ADL training;DME and/or AE instruction;Patient/family education   OT Goals(Current goals can be found in the care plan section) Acute Rehab OT Goals Patient Stated Goal: walk to bathroom OT Goal Formulation: With patient Time For Goal Achievement: 04/28/13 Potential to Achieve Goals: Good ADL Goals Pt Will Perform Grooming: with min guard assist;standing Pt Will Perform Lower Body Bathing: with min assist;with adaptive equipment;sit to/from stand Pt Will Perform Lower Body Dressing: with min assist;with adaptive equipment;sit to/from stand Management consultant) Pt Will Transfer to Toilet: with min assist;bedside commode;ambulating Pt Will Perform Toileting - Clothing Manipulation and hygiene: with min guard assist;sit to/from stand  Visit Information  Last OT Received On: 04/21/13 Assistance Needed: +2 (safety) History of Present Illness: R DA THA       Prior [place] expects to be discharged to:: Private residence Living Arrangements: Spouse/significant other Home Equipment: Environmental consultant - 2 wheels Prior Function Level of Independence: Independent Communication Communication: No  difficulties Dominant Hand: Right  Vision/Perception     Cognition  Cognition Arousal/Alertness: Awake/alert Behavior During Therapy: Anxious Overall Cognitive Status:  (when pt nauseas/could not attend & follow cues)    Extremity/Trunk Assessment Upper Extremity Assessment Upper Extremity Assessment: Overall WFL for tasks assessed     Mobility Bed Mobility Supine to sit: Mod assist General bed mobility comments: assist for RLE and trunk; cues for technique Transfers Sit to Stand: Min assist;Mod assist General transfer comment: mod from bed; min from chair/3:1     Exercise     Balance     End of Session OT - End of Session Activity Tolerance:  (limited by nausea) Patient left: in chair;with call bell/phone within reach;with family/visitor present Nurse Communication:  (nausea)  GO     Genevieve Arbaugh 04/21/2013, 10:00 AM Lesle Chris, OTR/L [phone] 04/21/2013

## 2013-04-21 NOTE — Progress Notes (Signed)
Physical Therapy Treatment Patient Details Name: Ashlee Beasley MRN: [id number] DOB: July 06, 1950 Today's Date: 04/21/2013 Time: 0109-3235 PT Time Calculation (min): 40 min  PT Assessment / Plan / Recommendation  History of Present Illness R DA THA   PT Comments   Pt progressing well;  Will need 24hr supervision for safety, family aware  Follow Up Recommendations  Home health PT;Supervision/Assistance - 24 hour     Does the patient have the potential to tolerate intense rehabilitation     Barriers to Discharge        Equipment Recommendations  None recommended by PT    Recommendations for Other Services    Frequency 7X/week   Progress towards PT Goals Progress towards PT goals: Progressing toward goals  Plan Current plan remains appropriate    Precautions / Restrictions Precautions Precautions: Fall Restrictions Other Position/Activity Restrictions: WBAT   Pertinent Vitals/Pain     Mobility  Bed Mobility Overal bed mobility: Needs Assistance Bed Mobility: Sit to Supine Sit to supine: Min assist General bed mobility comments: assist for RLE and trunk; cues for technique Transfers Overall transfer level: Needs assistance Equipment used: Rolling walker (2 wheeled) Transfers: Sit to/from Stand Sit to Stand: Min guard General transfer comment: multi-modal cues for hand placement and overall safety Ambulation/Gait Ambulation/Gait assistance: Min guard Ambulation Distance (Feet): 80 Feet Assistive device: Rolling walker (2 wheeled) Gait Pattern/deviations: Step-to pattern General Gait Details: requiring multi-modal cues for  sequence, RW distance from self; heel to toe and incr WBing on right Stairs: Yes Stairs assistance: Min assist Stair Management: One rail Right;With cane;Step to pattern;Forwards Number of Stairs: 5 (times 2 ) General stair comments: cues for sequence    Exercises Total Joint Exercises Short Arc Quad: AROM;Right;10 reps Heel Slides:  AROM;AAROM;Right;10 reps Hip ABduction/ADduction: AAROM;Right;10 reps;Supine   PT Diagnosis:    PT Problem List:   PT Treatment Interventions:     PT Goals (current goals can now be found in the care plan section) Acute Rehab PT Goals Patient Stated Goal: walk to bathroom PT Goal Formulation: With patient/family Time For Goal Achievement: 04/27/13 Potential to Achieve Goals: Good  Visit Information  Last PT Received On: 04/21/13 Assistance Needed: +2 History of Present Illness: R DA THA    Subjective Data  Patient Stated Goal: walk to bathroom   Cognition  Cognition Arousal/Alertness: Awake/alert Behavior During Therapy: Anxious Overall Cognitive Status: Impaired/Different from baseline Area of Impairment: Safety/judgement;Following commands;Attention;Problem solving Current Attention Level: Sustained Following Commands: Follows one step commands with increased time Safety/Judgement: Decreased awareness of safety;Decreased awareness of deficits Problem Solving: Difficulty sequencing;Requires verbal cues General Comments: improved this pm but still requires cues for all steps in functional task    Balance  Balance Overall balance assessment: Needs assistance Sitting balance-Leahy Scale: Fair Standing balance support: Single extremity supported;Bilateral upper extremity supported;During functional activity Standing balance-Leahy Scale: Fair Standing balance comment: pt with LOB in static stand while lifting hand talking; requires assist to prevent fall  End of Session PT - End of Session Equipment Utilized During Treatment: Gait belt Activity Tolerance: Patient tolerated treatment well Patient left: in bed;with call bell/phone within reach;with family/visitor present Nurse Communication: Mobility status   GP     [facility] 04/21/2013, 3:25 PM

## 2013-04-21 NOTE — Progress Notes (Signed)
[facility]  [facility] is providing the following services:  Patient has both rolling walker and commode at home.   If patient discharges after hours, please call [phone].   Ashlee Beasley 04/21/2013, 11:04 AM

## 2013-04-21 NOTE — Progress Notes (Signed)
   Subjective: 1 Day Post-Op Procedure(s) (LRB): RIGHT TOTAL HIP ARTHROPLASTY ANTERIOR APPROACH (Right)   Patient reports pain as mild, pain controlled. No events throughout the night. Ready to be discharged home if she does well with PT and pain stays controlled.   Objective:   VITALS:   Filed Vitals:   04/21/13 0525  BP: 106/71  Pulse: 86  Temp: 98.5 F (36.9 C)  Resp: 16    Neurovascular intact Dorsiflexion/Plantar flexion intact Incision: dressing C/D/I No cellulitis present Compartment soft  LABS  Recent Labs  04/21/13 0457  HGB 10.0*  HCT 30.2*  WBC 6.5  PLT 153     Recent Labs  04/21/13 0457  NA 137  K 4.0  BUN 12  CREATININE 0.90  GLUCOSE 114*     Assessment/Plan: 1 Day Post-Op Procedure(s) (LRB): RIGHT TOTAL HIP ARTHROPLASTY ANTERIOR APPROACH (Right) Foley cath d/c'ed Advance diet Up with therapy D/C IV fluids Discharge home with home health Follow up in 2 weeks at [facility]. Follow up with OLIN,Gretta Samons D in 2 weeks.  Contact information:  [facility] [address] [place] [phone]    Expected ABLA  Treated with iron and will observe        West Pugh. Keelen Quevedo   PAC  04/21/2013, 9:15 AM

## 2013-04-21 NOTE — Plan of Care (Signed)
Problem: Discharge Progression Outcomes Goal: Anticoagulant follow-up in place Outcome: Not Applicable Date Met:  63/78/58 asa

## 2013-04-22 NOTE — Progress Notes (Signed)
UR completed. Craig Ionescu RN CCM Case Mgmt phone [phone] 

## 2013-04-23 NOTE — Discharge Summary (Signed)
Physician Discharge Summary  Patient ID: Ashlee Beasley MRN: [id number] DOB/AGE: [birth year] 61 y.o.  Admit date: 04/20/2013 Discharge date: 04/21/2013   Procedures:  Procedure(s) (LRB): RIGHT TOTAL HIP ARTHROPLASTY ANTERIOR APPROACH (Right)  Attending Physician:  Dr. Paralee Cancel   Admission Diagnoses:   Right hip OA / pain  Discharge Diagnoses:  Principal Problem:   S/P right THA, AA Active Problems:   Expected blood loss anemia  Past Medical History  Diagnosis Date  . Seizures 1978 or 1979     had 2 seizures, no meds since 1982  . Complication of anesthesia   . Arthritis     oa  . Family history of anesthesia complication     mother n/v, blood clot x 1  few daysd after surgery    HPI:    Ashlee Beasley, 63 y.o. female, has a history of pain and functional disability in the right hip(s) due to arthritis and patient has failed non-surgical conservative treatments for greater than 12 weeks to include NSAID's and/or analgesics, corticosteriod injections, use of assistive devices and activity modification. Onset of symptoms was gradual starting 2+ years ago with gradually worsening course since that time.The patient noted past history of SCFE on the right hip(s). Patient currently rates pain in the right hip at 6 out of 10 with activity. Patient has night pain, worsening of pain with activity and weight bearing, trendelenberg gait, pain that interfers with activities of daily living and pain with passive range of motion. Patient has evidence of periarticular osteophytes and joint space narrowing by imaging studies. This condition presents safety issues increasing the risk of falls. There is no current active infection. Risks, benefits and expectations were discussed with the patient. Risks including but not limited to the risk of anesthesia, blood clots, nerve damage, blood vessel damage, failure of the prosthesis, infection and up to and including death. Patient understand the  risks, benefits and expectations and wishes to proceed with surgery.   PCP: Horatio Pel, MD   Discharged Condition: good  Hospital Course:  Patient underwent the above stated procedure on 04/20/2013. Patient tolerated the procedure well and brought to the recovery room in good condition and subsequently to the floor.  POD #1 BP: 106/71 ; Pulse: 86 ; Temp: 98.5 F (36.9 C) ; Resp: 16  Patient reports pain as mild, pain controlled. No events throughout the night. Ready to be discharged home. Neurovascular intact, dorsiflexion/plantar flexion intact, incision: dressing C/D/I, no cellulitis present and compartment soft.   LABS  Basename    HGB  10.0  HCT  30.2    Discharge Exam: General appearance: alert, cooperative and no distress Extremities: Homans sign is negative, no sign of DVT, no edema, redness or tenderness in the calves or thighs and no ulcers, gangrene or trophic changes  Disposition:   Home-Health Care Svc with follow up in 2 weeks   Follow-up Information   Follow up with Mauri Pole, MD. Schedule an appointment as soon as possible for a visit in 2 weeks.   Specialty:  Orthopedic Surgery   Contact information:   [address] [place] [address] [phone]       Discharge Orders   Future Orders Complete By Expires   Call MD / Call 911  As directed    Comments:     If you experience chest pain or shortness of breath, CALL 911 and be transported to the hospital emergency room.  If you develope a fever above  65 F, pus (white drainage) or increased drainage or redness at the wound, or calf pain, call your surgeon's office.   Change dressing  As directed    Comments:     Maintain surgical dressing for 10-14 days, or until follow up in the clinic.   Constipation Prevention  As directed    Comments:     Drink plenty of fluids.  Prune juice may be helpful.  You may use a stool softener, such as Colace (over the counter) 100 mg twice a  day.  Use MiraLax (over the counter) for constipation as needed.   Diet - low sodium heart healthy  As directed    Discharge instructions  As directed    Comments:     Maintain surgical dressing for 10-14 days, or until follow up in the clinic. Follow up in 2 weeks at [facility]. Call with any questions or concerns.   Increase activity slowly as tolerated  As directed    TED hose  As directed    Comments:     Use stockings (TED hose) for 2 weeks on both leg(s).  You may remove them at night for sleeping.   Weight bearing as tolerated  As directed    Questions:     Laterality:     Extremity:          Medication List         aspirin 325 MG EC tablet  Take 1 tablet (325 mg total) by mouth 2 (two) times daily.     calcium citrate-vitamin D 315-200 MG-UNIT per tablet  Commonly known as:  CITRACAL+D  Take 1 tablet by mouth 2 (two) times daily.     DSS 100 MG Caps  Take 100 mg by mouth 2 (two) times daily.     ferrous sulfate 325 (65 FE) MG tablet  Take 1 tablet (325 mg total) by mouth 3 (three) times daily after meals.     HYDROcodone-acetaminophen 7.5-325 MG per tablet  Commonly known as:  NORCO  Take 1-2 tablets by mouth every 4 (four) hours as needed for moderate pain.     methocarbamol 500 MG tablet  Commonly known as:  ROBAXIN  Take 1 tablet (500 mg total) by mouth every 6 (six) hours as needed for muscle spasms.     polyethylene glycol packet  Commonly known as:  MIRALAX / GLYCOLAX  Take 17 g by mouth 2 (two) times daily.         Signed: West Pugh. Jessamyn Watterson   PAC  04/23/2013, 5:22 PM

## 2013-04-25 NOTE — Op Note (Signed)
NAME:  Ashlee Beasley                ACCOUNT NO.: [id number]      MEDICAL RECORD NO.: [id number]      FACILITY:  [facility]      PHYSICIAN:  Paralee Cancel D  DATE OF BIRTH:  [birth year]     DATE OF PROCEDURE:  04/25/2013                                 OPERATIVE REPORT         PREOPERATIVE DIAGNOSIS: Right  hip osteoarthritis.      POSTOPERATIVE DIAGNOSIS:  Right hip osteoarthritis.      PROCEDURE:  Right total hip replacement through an anterior approach   utilizing DePuy THR system, component size 37mm pinnacle cup, a size 36+4 neutral   Altrex liner, a size 8 HI Tri Lock stem with a 36+1.5 delta ceramic   ball.      SURGEON:  Pietro Cassis. Alvan Dame, M.D.      ASSISTANT:  Molli Barrows, PA-C     ANESTHESIA:  Spinal.      SPECIMENS:  None.      COMPLICATIONS:  None.      BLOOD LOSS:  400 cc     DRAINS:  None     INDICATION OF THE PROCEDURE:  Ashlee Beasley is a 63 y.o. female who had   presented to office for evaluation of right hip pain.  Radiographs revealed   progressive degenerative changes with bone-on-bone   articulation to the  hip joint.  The patient had painful limited range of   motion significantly affecting their overall quality of life.  The patient was failing to    respond to conservative measures, and at this point was ready   to proceed with more definitive measures.  The patient has noted progressive   degenerative changes in his hip, progressive problems and dysfunction   with regarding the hip prior to surgery.  Consent was obtained for   benefit of pain relief.  Specific risk of infection, DVT, component   failure, dislocation, need for revision surgery, as well discussion of   the anterior versus posterior approach were reviewed.  Consent was   obtained for benefit of anterior pain relief through an anterior   approach.      PROCEDURE IN DETAIL:  The patient was brought to operative theater.   Once adequate anesthesia, preoperative  antibiotics, 2gm Ancef administered.   The patient was positioned supine on the OSI Hanna table.  Once adequate   padding of boney process was carried out, we had predraped out the hip, and  used fluoroscopy to confirm orientation of the pelvis and position.      The right hip was then prepped and draped from proximal iliac crest to   mid thigh with shower curtain technique.      Time-out was performed identifying the patient, planned procedure, and   extremity.     An incision was then made 2 cm distal and lateral to the   anterior superior iliac spine extending over the orientation of the   tensor fascia lata muscle and sharp dissection was carried down to the   fascia of the muscle and protractor placed in the soft tissues.      The fascia was then incised.  The muscle belly was identified and swept  laterally and retractor placed along the superior neck.  Following   cauterization of the circumflex vessels and removing some pericapsular   fat, a second cobra retractor was placed on the inferior neck.  A third   retractor was placed on the anterior acetabulum after elevating the   anterior rectus.  A L-capsulotomy was along the line of the   superior neck to the trochanteric fossa, then extended proximally and   distally.  Tag sutures were placed and the retractors were then placed   intracapsular.  We then identified the trochanteric fossa and   orientation of my neck cut, confirmed this radiographically   and then made a neck osteotomy with the femur on traction.  The femoral   head was removed without difficulty or complication.  Traction was let   off and retractors were placed posterior and anterior around the   acetabulum.      The labrum and foveal tissue were debrided.  I began reaming with a 77mm   reamer and reamed up to 66mm reamer with good bony bed preparation and a 52   cup was chosen.  The final 25mm Pinnacle cup was then impacted under fluoroscopy  to confirm the  depth of penetration and orientation with respect to   abduction.  A screw was placed followed by the hole eliminator.  The final   36+4 neutral Altrex liner was impacted with good visualized rim fit.  The cup was positioned anatomically within the acetabular portion of the pelvis.      At this point, the femur was rolled at 80 degrees.  Further capsule was   released off the inferior aspect of the femoral neck.  I then   released the superior capsule proximally.  The hook was placed laterally   along the femur and elevated manually and held in position with the bed   hook.  The leg was then extended and adducted with the leg rolled to 100   degrees of external rotation.  Once the proximal femur was fully   exposed, I used a box osteotome to set orientation.  I then began   broaching with the starting chili pepper broach and passed this by hand and then broached up to 8.  With the 8 broach in place I chose a high offset neck and did a trial reduction.  The offset was appropriate, leg lengths   appeared to be equal, confirmed radiographically.   Given these findings, I went ahead and dislocated the hip, repositioned all   retractors and positioned the right hip in the extended and abducted position.  The final 8 Hi Tri Lock stem was   chosen and it was impacted down to the level of neck cut.  Based on this   and the trial reduction, a 36+1.5 delta ceramic ball was chosen and   impacted onto a clean and dry trunnion, and the hip was reduced.  The   hip had been irrigated throughout the case again at this point.  I did   reapproximate the superior capsular leaflet to the anterior leaflet   using #1 Vicryl.  The fascia of the   tensor fascia lata muscle was then reapproximated using #1 Vicryl and #0 V-lock sutures.  The   remaining wound was closed with 2-0 Vicryl and running 4-0 Monocryl.   The hip was cleaned, dried, and dressed sterilely using Dermabond and   Aquacel dressing.  She was then  brought   to recovery room  in stable condition tolerating the procedure well.    Molli Barrows, PA-C was present for the entirety of the case involved from   preoperative positioning, perioperative retractor management, general   facilitation of the case, as well as primary wound closure as assistant.            Pietro Cassis Alvan Dame, M.D.        04/25/2013 7:53 AM

## 2014-01-19 ENCOUNTER — Encounter: Payer: Self-pay | Admitting: Internal Medicine

## 2014-03-09 ENCOUNTER — Ambulatory Visit (AMBULATORY_SURGERY_CENTER): Payer: Self-pay

## 2014-03-09 VITALS — Ht 64.5 in | Wt 145.0 lb

## 2014-03-09 DIAGNOSIS — Z800 Family history of malignant neoplasm of digestive organs: Secondary | ICD-10-CM

## 2014-03-09 MED ORDER — MOVIPREP 100 G PO SOLR: 1.0000 | Freq: Once | ORAL | Status: DC

## 2014-03-09 NOTE — Progress Notes (Signed)
No personal hx problem with anesthesia; her mother however has PONV with general anesthesia No allergies to eggs or soy No diet/weight loss meds No home oxygen  Has email  Emmi instructions given for colonoscopy

## 2014-03-11 ENCOUNTER — Telehealth: Payer: Self-pay

## 2014-03-11 NOTE — Telephone Encounter (Signed)
Dr. Henrene Pastor reviewed patient's colon with Dr. Lajoyce Corners.  Said ok to proceed with screening colonoscopy.

## 2014-03-23 ENCOUNTER — Encounter: Payer: BLUE CROSS/BLUE SHIELD | Admitting: Internal Medicine

## 2017-02-18 DIAGNOSIS — M79671 Pain in right foot: Secondary | ICD-10-CM | POA: Diagnosis not present

## 2017-02-18 DIAGNOSIS — M2041 Other hammer toe(s) (acquired), right foot: Secondary | ICD-10-CM | POA: Diagnosis not present

## 2017-02-18 DIAGNOSIS — L84 Corns and callosities: Secondary | ICD-10-CM | POA: Diagnosis not present

## 2017-03-12 DIAGNOSIS — Z0001 Encounter for general adult medical examination with abnormal findings: Secondary | ICD-10-CM | POA: Diagnosis not present

## 2017-03-12 DIAGNOSIS — M8580 Other specified disorders of bone density and structure, unspecified site: Secondary | ICD-10-CM | POA: Diagnosis not present

## 2017-03-12 DIAGNOSIS — E559 Vitamin D deficiency, unspecified: Secondary | ICD-10-CM | POA: Diagnosis not present

## 2017-03-17 DIAGNOSIS — R42 Dizziness and giddiness: Secondary | ICD-10-CM | POA: Diagnosis not present

## 2017-03-17 DIAGNOSIS — Z0000 Encounter for general adult medical examination without abnormal findings: Secondary | ICD-10-CM | POA: Diagnosis not present

## 2017-03-17 DIAGNOSIS — Z87891 Personal history of nicotine dependence: Secondary | ICD-10-CM | POA: Diagnosis not present

## 2017-03-17 DIAGNOSIS — Z800 Family history of malignant neoplasm of digestive organs: Secondary | ICD-10-CM | POA: Diagnosis not present

## 2017-04-17 DIAGNOSIS — H2513 Age-related nuclear cataract, bilateral: Secondary | ICD-10-CM | POA: Diagnosis not present

## 2017-04-29 DIAGNOSIS — Z1231 Encounter for screening mammogram for malignant neoplasm of breast: Secondary | ICD-10-CM | POA: Diagnosis not present

## 2018-04-03 DIAGNOSIS — Z471 Aftercare following joint replacement surgery: Secondary | ICD-10-CM | POA: Diagnosis not present

## 2018-04-03 DIAGNOSIS — Z96641 Presence of right artificial hip joint: Secondary | ICD-10-CM | POA: Diagnosis not present

## 2018-04-13 DIAGNOSIS — Z1159 Encounter for screening for other viral diseases: Secondary | ICD-10-CM | POA: Diagnosis not present

## 2018-04-13 DIAGNOSIS — D72819 Decreased white blood cell count, unspecified: Secondary | ICD-10-CM | POA: Diagnosis not present

## 2018-04-13 DIAGNOSIS — Z0000 Encounter for general adult medical examination without abnormal findings: Secondary | ICD-10-CM | POA: Diagnosis not present

## 2018-04-20 DIAGNOSIS — M8580 Other specified disorders of bone density and structure, unspecified site: Secondary | ICD-10-CM | POA: Diagnosis not present

## 2018-04-20 DIAGNOSIS — R030 Elevated blood-pressure reading, without diagnosis of hypertension: Secondary | ICD-10-CM | POA: Diagnosis not present

## 2018-04-20 DIAGNOSIS — Z0000 Encounter for general adult medical examination without abnormal findings: Secondary | ICD-10-CM | POA: Diagnosis not present

## 2018-04-20 DIAGNOSIS — R42 Dizziness and giddiness: Secondary | ICD-10-CM | POA: Diagnosis not present

## 2018-04-23 DIAGNOSIS — F419 Anxiety disorder, unspecified: Secondary | ICD-10-CM | POA: Diagnosis not present

## 2018-04-23 DIAGNOSIS — R030 Elevated blood-pressure reading, without diagnosis of hypertension: Secondary | ICD-10-CM | POA: Diagnosis not present

## 2018-07-20 DIAGNOSIS — L814 Other melanin hyperpigmentation: Secondary | ICD-10-CM | POA: Diagnosis not present

## 2018-07-20 DIAGNOSIS — L821 Other seborrheic keratosis: Secondary | ICD-10-CM | POA: Diagnosis not present

## 2018-07-20 DIAGNOSIS — D225 Melanocytic nevi of trunk: Secondary | ICD-10-CM | POA: Diagnosis not present

## 2018-07-20 DIAGNOSIS — Z85828 Personal history of other malignant neoplasm of skin: Secondary | ICD-10-CM | POA: Diagnosis not present

## 2018-07-29 DIAGNOSIS — Z1231 Encounter for screening mammogram for malignant neoplasm of breast: Secondary | ICD-10-CM | POA: Diagnosis not present

## 2018-11-13 DIAGNOSIS — Z23 Encounter for immunization: Secondary | ICD-10-CM | POA: Diagnosis not present

## 2019-01-01 DIAGNOSIS — Z1211 Encounter for screening for malignant neoplasm of colon: Secondary | ICD-10-CM | POA: Diagnosis not present

## 2019-01-01 DIAGNOSIS — Z1212 Encounter for screening for malignant neoplasm of rectum: Secondary | ICD-10-CM | POA: Diagnosis not present

## 2019-04-23 DIAGNOSIS — Z0000 Encounter for general adult medical examination without abnormal findings: Secondary | ICD-10-CM | POA: Diagnosis not present

## 2019-04-28 DIAGNOSIS — M8580 Other specified disorders of bone density and structure, unspecified site: Secondary | ICD-10-CM | POA: Diagnosis not present

## 2019-04-28 DIAGNOSIS — Z0000 Encounter for general adult medical examination without abnormal findings: Secondary | ICD-10-CM | POA: Diagnosis not present

## 2019-04-28 DIAGNOSIS — R42 Dizziness and giddiness: Secondary | ICD-10-CM | POA: Diagnosis not present

## 2019-04-28 DIAGNOSIS — Z800 Family history of malignant neoplasm of digestive organs: Secondary | ICD-10-CM | POA: Diagnosis not present

## 2019-07-29 DIAGNOSIS — M8589 Other specified disorders of bone density and structure, multiple sites: Secondary | ICD-10-CM | POA: Diagnosis not present

## 2019-07-29 DIAGNOSIS — M85852 Other specified disorders of bone density and structure, left thigh: Secondary | ICD-10-CM | POA: Diagnosis not present

## 2019-08-04 DIAGNOSIS — Z1231 Encounter for screening mammogram for malignant neoplasm of breast: Secondary | ICD-10-CM | POA: Diagnosis not present

## 2020-05-09 DIAGNOSIS — Z0000 Encounter for general adult medical examination without abnormal findings: Secondary | ICD-10-CM | POA: Diagnosis not present

## 2020-05-12 ENCOUNTER — Other Ambulatory Visit: Payer: Self-pay | Admitting: Internal Medicine

## 2020-05-12 DIAGNOSIS — M9111 Juvenile osteochondrosis of head of femur [Legg-Calve-Perthes], right leg: Secondary | ICD-10-CM | POA: Diagnosis not present

## 2020-05-12 DIAGNOSIS — M8580 Other specified disorders of bone density and structure, unspecified site: Secondary | ICD-10-CM | POA: Diagnosis not present

## 2020-05-12 DIAGNOSIS — Z0001 Encounter for general adult medical examination with abnormal findings: Secondary | ICD-10-CM | POA: Diagnosis not present

## 2020-05-12 DIAGNOSIS — Z800 Family history of malignant neoplasm of digestive organs: Secondary | ICD-10-CM | POA: Diagnosis not present

## 2020-05-16 ENCOUNTER — Encounter: Payer: Self-pay | Admitting: Internal Medicine

## 2020-05-30 ENCOUNTER — Ambulatory Visit
Admission: RE | Admit: 2020-05-30 | Discharge: 2020-05-30 | Disposition: A | Discharge status: Home / Self Care | Payer: BLUE CROSS/BLUE SHIELD | Source: Ambulatory Visit | Attending: Internal Medicine | Admitting: Internal Medicine

## 2020-05-30 DIAGNOSIS — Z0001 Encounter for general adult medical examination with abnormal findings: Secondary | ICD-10-CM

## 2020-07-05 DIAGNOSIS — H2513 Age-related nuclear cataract, bilateral: Secondary | ICD-10-CM | POA: Diagnosis not present

## 2020-07-14 DIAGNOSIS — L821 Other seborrheic keratosis: Secondary | ICD-10-CM | POA: Diagnosis not present

## 2020-07-14 DIAGNOSIS — L814 Other melanin hyperpigmentation: Secondary | ICD-10-CM | POA: Diagnosis not present

## 2020-07-14 DIAGNOSIS — D225 Melanocytic nevi of trunk: Secondary | ICD-10-CM | POA: Diagnosis not present

## 2020-07-14 DIAGNOSIS — Z85828 Personal history of other malignant neoplasm of skin: Secondary | ICD-10-CM | POA: Diagnosis not present

## 2020-08-09 ENCOUNTER — Ambulatory Visit (AMBULATORY_SURGERY_CENTER): Payer: Medicare Other

## 2020-08-09 ENCOUNTER — Other Ambulatory Visit: Payer: Self-pay

## 2020-08-09 VITALS — Ht 64.5 in | Wt 145.0 lb

## 2020-08-09 DIAGNOSIS — Z1231 Encounter for screening mammogram for malignant neoplasm of breast: Secondary | ICD-10-CM | POA: Diagnosis not present

## 2020-08-09 DIAGNOSIS — Z1211 Encounter for screening for malignant neoplasm of colon: Secondary | ICD-10-CM

## 2020-08-09 MED ORDER — ONDANSETRON HCL 4 MG PO TABS: 4.0000 mg | ORAL_TABLET | ORAL | 0 refills | Status: DC

## 2020-08-09 MED ORDER — SUTAB 1479-225-188 MG PO TABS: 1.0000 | ORAL_TABLET | ORAL | 0 refills | Status: DC

## 2020-08-09 NOTE — Progress Notes (Signed)
Pt report last seizure was back in the early 1980.  Pt decided to go with Sutabs and will also send Zofran to pharmacy to prevent n&v.   Patient is here in-person for PV. Patient denies any allergies to eggs or soy. Patient denies any problems with anesthesia/sedation. Patient denies any oxygen use at home. Patient denies taking any diet/weight loss medications or blood thinners. Patient is not being treated for MRSA or C-diff. Patient is aware of our care-partner policy and UIVHO-64 safety protocol. EMMI education assigned to the patient for the procedure, sent to [place].   Patient is COVID-19 vaccinated, per patient.   Prep Prescription coupon given to the patient.

## 2020-08-23 ENCOUNTER — Encounter: Payer: Self-pay | Admitting: Internal Medicine

## 2020-08-23 ENCOUNTER — Other Ambulatory Visit: Payer: Self-pay

## 2020-08-23 ENCOUNTER — Ambulatory Visit (AMBULATORY_SURGERY_CENTER): Payer: Medicare Other | Admitting: Internal Medicine

## 2020-08-23 VITALS — BP 122/71 | HR 66 | Temp 97.3°F | Resp 14 | Ht 64.5 in | Wt 145.0 lb

## 2020-08-23 DIAGNOSIS — Z1211 Encounter for screening for malignant neoplasm of colon: Secondary | ICD-10-CM

## 2020-08-23 DIAGNOSIS — D122 Benign neoplasm of ascending colon: Secondary | ICD-10-CM | POA: Diagnosis not present

## 2020-08-23 DIAGNOSIS — D120 Benign neoplasm of cecum: Secondary | ICD-10-CM

## 2020-08-23 MED ORDER — SODIUM CHLORIDE 0.9 % IV SOLN: 500.0000 mL | Freq: Once | INTRAVENOUS | Status: DC

## 2020-08-23 NOTE — Progress Notes (Signed)
Vs by CW in adm  Pt's states no medical or surgical changes since previsit or office visit.     

## 2020-08-23 NOTE — Progress Notes (Signed)
Called to room to assist during endoscopic procedure.  Patient ID and intended procedure confirmed with present staff. Received instructions for my participation in the procedure from the performing physician.  

## 2020-08-23 NOTE — Op Note (Signed)
[place] Patient Name: Ashlee Beasley Procedure Date: 08/23/2020 7:29 AM MRN: [id number] Endoscopist: Docia Chuck. Henrene Pastor , MD Age: 70 Referring MD:  Date of Birth: Nov 30, 1950 Gender: Female Account #: [id number] Procedure:                Colonoscopy with cold snare polypectomy x 4 Indications:              Screening for colorectal malignant neoplasm.                            Previous exam elsewhere 2004 was negative for                            neoplasia. The patient reports having had 2                            negative Cologuard test subsequently. Father                            possibly with colon cancer at an advanced age. Now                            for screening colonoscopy Medicines:                Monitored Anesthesia Care Procedure:                Pre-Anesthesia Assessment:                           - Prior to the procedure, a History and Physical                            was performed, and patient medications and                            allergies were reviewed. The patient's tolerance of                            previous anesthesia was also reviewed. The risks                            and benefits of the procedure and the sedation                            options and risks were discussed with the patient.                            All questions were answered, and informed consent                            was obtained. Prior Anticoagulants: The patient has                            taken no previous anticoagulant or antiplatelet  agents. After reviewing the risks and benefits, the                            patient was deemed in satisfactory condition to                            undergo the procedure.                           After obtaining informed consent, the colonoscope                            was passed under direct vision. Throughout the                            procedure, the patient's blood pressure,  pulse, and                            oxygen saturations were monitored continuously. The                            CF [id number] #[id number] was introduced through the anus                            and advanced to the the cecum, identified by                            appendiceal orifice and ileocecal valve. The                            ileocecal valve, appendiceal orifice, and rectum                            were photographed. The quality of the bowel                            preparation was excellent. The colonoscopy was                            performed without difficulty. The patient tolerated                            the procedure well. The bowel preparation used was                            SUPREP via split dose instruction. Scope In: 8:24:46 AM Scope Out: 8:42:44 AM Scope Withdrawal Time: 0 hours 15 minutes 39 seconds  Total Procedure Duration: 0 hours 17 minutes 58 seconds  Findings:                 Four polyps were found in the ascending colon and                            cecum. The polyps were 2 to 4 mm in size.  These                            polyps were removed with a cold snare. Resection                            and retrieval were complete.                           The exam was otherwise without abnormality on                            direct and retroflexion views. Complications:            No immediate complications. Estimated blood loss:                            None. Estimated Blood Loss:     Estimated blood loss: none. Impression:               - Four 2 to 4 mm polyps in the ascending colon and                            in the cecum, removed with a cold snare. Resected                            and retrieved.                           - The examination was otherwise normal on direct                            and retroflexion views. Recommendation:           - Repeat colonoscopy in 3 - 5 years for                            surveillance.                            - Patient has a contact number available for                            emergencies. The signs and symptoms of potential                            delayed complications were discussed with the                            patient. Return to normal activities tomorrow.                            Written discharge instructions were provided to the                            patient.                           -  Resume previous diet.                           - Continue present medications.                           - Await pathology results. Docia Chuck. Henrene Pastor, MD 08/23/2020 9:08:53 AM This report has been signed electronically.

## 2020-08-23 NOTE — Progress Notes (Signed)
PT taken to PACU. Monitors in place. VSS. Report given to RN. 

## 2020-08-23 NOTE — Patient Instructions (Signed)
Thank you for allowing [place] to care for you today!  Biopsy results typically take 7-10 days, we will notify you by phone/mail when we receive results.  Resume previous diet and medications today.  Resume normal daily activities tomorrow, 08/24/20.    YOU HAD AN ENDOSCOPIC PROCEDURE TODAY AT [facility]:   Refer to the procedure report that was given to you for any specific questions about what was found during the examination.  If the procedure report does not answer your questions, please call your gastroenterologist to clarify.  If you requested that your care partner not be given the details of your procedure findings, then the procedure report has been included in a sealed envelope for you to review at your convenience later.  YOU SHOULD EXPECT: Some feelings of bloating in the abdomen. Passage of more gas than usual.  Walking can help get rid of the air that was put into your GI tract during the procedure and reduce the bloating. If you had a lower endoscopy (such as a colonoscopy or flexible sigmoidoscopy) you may notice spotting of blood in your stool or on the toilet paper. If you underwent a bowel prep for your procedure, you may not have a normal bowel movement for a few days.  Please Note:  You might notice some irritation and congestion in your nose or some drainage.  This is from the oxygen used during your procedure.  There is no need for concern and it should clear up in a day or so.  SYMPTOMS TO REPORT IMMEDIATELY:  Following lower endoscopy (colonoscopy or flexible sigmoidoscopy):  Excessive amounts of blood in the stool  Significant tenderness or worsening of abdominal pains  Swelling of the abdomen that is new, acute  Fever of 100F or higher    For urgent or emergent issues, a gastroenterologist can be reached at any hour by calling [phone]. Do not use MyChart messaging for urgent concerns.    DIET:  We do recommend a small meal at first, but  then you may proceed to your regular diet.  Drink plenty of fluids but you should avoid alcoholic beverages for 24 hours.  ACTIVITY:  You should plan to take it easy for the rest of today and you should NOT DRIVE or use heavy machinery until tomorrow (because of the sedation medicines used during the test).    FOLLOW UP: Our staff will call the number listed on your records 48-72 hours following your procedure to check on you and address any questions or concerns that you may have regarding the information given to you following your procedure. If we do not reach you, we will leave a message.  We will attempt to reach you two times.  During this call, we will ask if you have developed any symptoms of COVID 19. If you develop any symptoms (ie: fever, flu-like symptoms, shortness of breath, cough etc.) before then, please call [phone].  If you test positive for Covid 19 in the 2 weeks post procedure, please call and report this information to [place].    If any biopsies were taken you will be contacted by phone or by letter within the next 1-3 weeks.  Please call us at [phone] if you have not heard about the biopsies in 3 weeks.    SIGNATURES/CONFIDENTIALITY: You and/or your care partner have signed paperwork which will be entered into your electronic medical record.  These signatures attest to the fact that that the information above on  your After Visit Summary has been reviewed and is understood.  Full responsibility of the confidentiality of this discharge information lies with you and/or your care-partner.

## 2020-08-25 ENCOUNTER — Telehealth: Payer: Self-pay | Admitting: *Deleted

## 2020-08-25 NOTE — Telephone Encounter (Signed)
  Follow up Call-  Call back number 08/23/2020  Post procedure Call Back phone  # [phone]  Permission to leave phone message Yes  Some recent data might be hidden     Patient questions:  Do you have a fever, pain , or abdominal swelling? No. Pain Score  0 *  Have you tolerated food without any problems? Yes.    Have you been able to return to your normal activities? Yes.    Do you have any questions about your discharge instructions: Diet   No. Medications  No. Follow up visit  No.  Do you have questions or concerns about your Care? No.  Actions: * If pain score is 4 or above: No action needed, pain <4.  Have you developed a fever since your procedure? no  2.   Have you had an respiratory symptoms (SOB or cough) since your procedure? no  3.   Have you tested positive for COVID 19 since your procedure no  4.   Have you had any family members/close contacts diagnosed with the COVID 19 since your procedure?  no   If yes to any of these questions please route to Joylene John, RN and Joella Prince, RN

## 2020-08-28 ENCOUNTER — Encounter: Payer: Self-pay | Admitting: Internal Medicine

## 2020-11-25 ENCOUNTER — Emergency Department (HOSPITAL_BASED_OUTPATIENT_CLINIC_OR_DEPARTMENT_OTHER): Payer: Medicare Other

## 2020-11-25 ENCOUNTER — Other Ambulatory Visit: Payer: Self-pay

## 2020-11-25 ENCOUNTER — Emergency Department (HOSPITAL_BASED_OUTPATIENT_CLINIC_OR_DEPARTMENT_OTHER)
Admission: EM | Admit: 2020-11-25 | Discharge: 2020-11-25 | Disposition: A | Discharge status: Home / Self Care | Payer: Medicare Other | Attending: Emergency Medicine | Admitting: Emergency Medicine

## 2020-11-25 ENCOUNTER — Encounter (HOSPITAL_BASED_OUTPATIENT_CLINIC_OR_DEPARTMENT_OTHER): Payer: Self-pay | Admitting: Emergency Medicine

## 2020-11-25 DIAGNOSIS — Z96641 Presence of right artificial hip joint: Secondary | ICD-10-CM | POA: Diagnosis not present

## 2020-11-25 DIAGNOSIS — R051 Acute cough: Secondary | ICD-10-CM | POA: Insufficient documentation

## 2020-11-25 DIAGNOSIS — R059 Cough, unspecified: Secondary | ICD-10-CM | POA: Diagnosis not present

## 2020-11-25 DIAGNOSIS — Z87891 Personal history of nicotine dependence: Secondary | ICD-10-CM | POA: Insufficient documentation

## 2020-11-25 NOTE — ED Triage Notes (Signed)
Pt reports cough for last several days. Pt denies any known fever, chills.airway patent.

## 2020-11-25 NOTE — Discharge Instructions (Addendum)
Try dextromethorphan over-the-counter.  This is commonly found in medication such as Robitussin or TheraFlu.  During the daytime you can also take cough drops to suppress your cough.  Follow-up with your doctor within the week, however return to the ER if you have difficulty breathing fevers pain or any additional concerns.

## 2020-11-25 NOTE — ED Provider Notes (Signed)
[facility] DEPT Provider Note   CSN: [id number] Arrival date & time: 11/25/20  6144     History Chief Complaint  Patient presents with   Cough    Ashlee Beasley is a 70 y.o. female.  Patient presents ER chief complaint of cough ongoing for 10 days.  Denies any blood, denies any fevers or runny nose.  Denies any headache or chest pain or difficulty breathing.  Denies any abdominal pain.  Patient says she is concerned because her cough is not improving despite taking over-the-counter medications.      Past Medical History:  Diagnosis Date   Arthritis    oa   Complication of anesthesia    pt denies for herself.  only family history with her mother   Family history of anesthesia complication    mother n/v, blood clot x 1  few daysd after surgery   Seizures ([place]) 1978 or 1979    had 2 seizures, no meds since 1982   Tension headache     Patient Active Problem List   Diagnosis Date Noted   Expected blood loss anemia 04/21/2013   S/P right THA, AA 04/20/2013    Past Surgical History:  Procedure Laterality Date   ABDOMINAL HYSTERECTOMY  02/04/1989   COLONOSCOPY     colonscopy  over 10 yrs ago   [place] Right 04/20/2013   Procedure: RIGHT TOTAL HIP ARTHROPLASTY ANTERIOR APPROACH;  Surgeon: Mauri Pole, MD;  Location: [facility];  Service: Orthopedics;  Laterality: Right;     OB History   No obstetric history on file.     Family History  Problem Relation Age of Onset   Pancreatic cancer Mother    Colon cancer Father 42       died 4   Colon polyps Sister    Esophageal cancer Neg Hx    Rectal cancer Neg Hx    Stomach cancer Neg Hx     Social History   Tobacco Use   Smoking status: Former    Packs/day: 0.50    Years: 10.00    Pack years: 5.00    Types: Cigarettes    Quit date: 02/04/1998    Years since quitting: 22.8   Smokeless tobacco: Never  Vaping Use   Vaping Use: Never used  Substance Use Topics   Alcohol  use: Yes    Comment: occasional beer   Drug use: No    Home Medications Prior to Admission medications   Medication Sig Start Date End Date Taking? Authorizing Provider  acetaminophen (TYLENOL) 325 MG tablet Take 650 mg by mouth every 6 (six) hours as needed.   Yes [provider]  calcium citrate-vitamin D (CITRACAL+D) 315-200 MG-UNIT per tablet Take 1 tablet by mouth 2 (two) times daily.   Yes [provider]    Allergies    Phenobarbital  Review of Systems   Review of Systems  Constitutional:  Negative for fever.  HENT:  Negative for ear pain.   Eyes:  Negative for pain.  Respiratory:  Positive for cough.   Cardiovascular:  Negative for chest pain.  Gastrointestinal:  Negative for abdominal pain.  Genitourinary:  Negative for flank pain.  Musculoskeletal:  Negative for back pain.  Skin:  Negative for rash.  Neurological:  Negative for headaches.   Physical Exam Updated Vital Signs BP (!) 155/86 (BP Location: Right Arm)   Pulse 85   Temp 98.3 F (36.8 C) (Oral)   Resp 18  Ht 5\' 4"  (1.626 m)   Wt 62.6 kg   SpO2 100%   BMI 23.69 kg/m   Physical Exam Constitutional:      General: She is not in acute distress.    Appearance: Normal appearance.  HENT:     Head: Normocephalic.     Nose: Nose normal.  Eyes:     Extraocular Movements: Extraocular movements intact.  Cardiovascular:     Rate and Rhythm: Normal rate.  Pulmonary:     Effort: Pulmonary effort is normal. No respiratory distress.     Breath sounds: Normal breath sounds. No wheezing, rhonchi or rales.  Musculoskeletal:        General: Normal range of motion.     Cervical back: Normal range of motion.  Neurological:     General: No focal deficit present.     Mental Status: She is alert. Mental status is at baseline.    ED Results / Procedures / Treatments   Labs (all labs ordered are listed, but only abnormal results are displayed) Labs Reviewed - No data to  display  EKG None  Radiology DG Chest Portable 1 View  Result Date: 11/25/2020 CLINICAL DATA:  Cough for 10 days. EXAM: PORTABLE CHEST 1 VIEW COMPARISON:  05/30/2020 CT FINDINGS: Heart size is normal. There is mild perihilar peribronchial thickening. No focal consolidations or effusions. No pulmonary edema. IMPRESSION: Mild bronchitic changes. Electronically Signed   By: Nolon Nations M.D.   On: 11/25/2020 09:28    Procedures Procedures   Medications Ordered in ED Medications - No data to display  ED Course  I have reviewed the triage vital signs and the nursing notes.  Pertinent labs & imaging results that were available during my care of the patient were reviewed by me and considered in my medical decision making (see chart for details).    MDM Rules/Calculators/A&P                           Chest x-ray is unremarkable.  Vital signs within normal limits.  Suspect viral URI or postviral cough.  Recommending outpatient follow-up with her doctor within the week, advised return for worsening symptoms or any additional concerns. Recommending cough drops and dextromethorphan over-the-counter for the next 3 to 5 days.  Final Clinical Impression(s) / ED Diagnoses Final diagnoses:  Acute cough    Rx / DC Orders ED Discharge Orders     None        Luna Fuse, MD 11/25/20 [id number]

## 2021-05-14 DIAGNOSIS — Z0000 Encounter for general adult medical examination without abnormal findings: Secondary | ICD-10-CM | POA: Diagnosis not present

## 2021-05-14 DIAGNOSIS — D72819 Decreased white blood cell count, unspecified: Secondary | ICD-10-CM | POA: Diagnosis not present

## 2021-05-14 DIAGNOSIS — R3129 Other microscopic hematuria: Secondary | ICD-10-CM | POA: Diagnosis not present

## 2021-05-14 DIAGNOSIS — R030 Elevated blood-pressure reading, without diagnosis of hypertension: Secondary | ICD-10-CM | POA: Diagnosis not present

## 2021-05-14 DIAGNOSIS — R300 Dysuria: Secondary | ICD-10-CM | POA: Diagnosis not present

## 2021-05-17 DIAGNOSIS — R058 Other specified cough: Secondary | ICD-10-CM | POA: Diagnosis not present

## 2021-05-17 DIAGNOSIS — Z0000 Encounter for general adult medical examination without abnormal findings: Secondary | ICD-10-CM | POA: Diagnosis not present

## 2021-05-17 DIAGNOSIS — J302 Other seasonal allergic rhinitis: Secondary | ICD-10-CM | POA: Diagnosis not present

## 2021-05-17 DIAGNOSIS — Z809 Family history of malignant neoplasm, unspecified: Secondary | ICD-10-CM | POA: Diagnosis not present

## 2021-08-15 DIAGNOSIS — Z1231 Encounter for screening mammogram for malignant neoplasm of breast: Secondary | ICD-10-CM | POA: Diagnosis not present

## 2021-09-03 ENCOUNTER — Telehealth: Payer: Self-pay | Admitting: Internal Medicine

## 2021-09-03 NOTE — Telephone Encounter (Signed)
No problem.

## 2021-09-03 NOTE — Telephone Encounter (Signed)
Hello Dr. Henrene Pastor,    We received a referral for Genetic testing, after speaking with the patient she requested to transfer her care over to Dr Loletha Carrow (Her preference) please advise on scheduling.    Thank you

## 2021-09-05 NOTE — Telephone Encounter (Signed)
Hi Dr. Loletha Carrow,   Would you accept the transfer of care request below.  Thanks

## 2021-09-06 ENCOUNTER — Telehealth: Payer: Self-pay | Admitting: Genetic Counselor

## 2021-09-06 NOTE — Telephone Encounter (Signed)
The reason for referral is for genetic testing, which is a service we do not provide.  While I can accept transfer of care if she has active GI issues, if the intention of her primary care provider was for her to have genetic testing, they must refer her to the [facility] clinic.  - HD

## 2021-09-06 NOTE — Telephone Encounter (Signed)
The patient has been advised.

## 2021-09-06 NOTE — Telephone Encounter (Signed)
Scheduled appt per 8/3 referral. Pt is aware of appt date and time. Pt is aware to arrive 15 mins prior to appt time and to bring and updated insurance card. Pt is aware of appt location.   

## 2021-10-02 DIAGNOSIS — H2513 Age-related nuclear cataract, bilateral: Secondary | ICD-10-CM | POA: Diagnosis not present

## 2021-10-30 ENCOUNTER — Inpatient Hospital Stay: Payer: Medicare Other

## 2021-10-30 ENCOUNTER — Inpatient Hospital Stay: Payer: Medicare Other | Admitting: Genetic Counselor

## 2021-10-30 ENCOUNTER — Other Ambulatory Visit: Payer: Self-pay

## 2021-10-30 ENCOUNTER — Other Ambulatory Visit: Payer: Self-pay | Admitting: Genetic Counselor

## 2021-10-30 ENCOUNTER — Encounter: Payer: Self-pay | Admitting: Genetic Counselor

## 2021-10-30 DIAGNOSIS — Z87891 Personal history of nicotine dependence: Secondary | ICD-10-CM | POA: Diagnosis not present

## 2021-10-30 DIAGNOSIS — Z800 Family history of malignant neoplasm of digestive organs: Secondary | ICD-10-CM | POA: Insufficient documentation

## 2021-10-30 LAB — GENETIC SCREENING ORDER

## 2021-10-30 NOTE — Progress Notes (Signed)
REFERRING PROVIDER: Deon Pilling, NP [address] [place],  [place] [address]  PRIMARY PROVIDER:  Deland Pretty, MD  PRIMARY REASON FOR VISIT:  1. Family history of colon cancer in father   2. Family history of pancreatic cancer      HISTORY OF PRESENT ILLNESS:   [name], a 71 y.o. female, was seen for a [facility] cancer genetics consultation at the request of Dr. Hall Busing due to a family history of pancreatic and colon cancer.  Ashlee Beasley presents to clinic today to discuss the possibility of a hereditary predisposition to cancer, genetic testing, and to further clarify her future cancer risks, as well as potential cancer risks for family members.   Ashlee Beasley is a 71 y.o. female with no personal history of cancer.    CANCER HISTORY:  Oncology History   No history exists.     RISK FACTORS:  Menarche was at age 36.  First live birth at age N/A.  Ovaries intact: unsure.  Hysterectomy: yes.  Menopausal status: postmenopausal.  HRT use: 0 years. Colonoscopy: yes;  3 polyps . Mammogram within the last year: yes. Number of breast biopsies: 0. Up to date with pelvic exams: no. Any excessive radiation exposure in the past: no  Past Medical History:  Diagnosis Date   Arthritis    oa   Complication of anesthesia    pt denies for herself.  only family history with her mother   Family history of anesthesia complication    mother n/v, blood clot x 1  few daysd after surgery   Family history of colon cancer in father    Family history of pancreatic cancer    Seizures ([place]) 1978 or 1979    had 2 seizures, no meds since 1982   Tension headache     Past Surgical History:  Procedure Laterality Date   ABDOMINAL HYSTERECTOMY  02/04/1989   COLONOSCOPY     colonscopy  over 10 yrs ago   [place] Right 04/20/2013   Procedure: RIGHT TOTAL HIP ARTHROPLASTY ANTERIOR APPROACH;  Surgeon: Mauri Pole, MD;  Location: [facility];  Service: Orthopedics;  Laterality:  Right;    Social History   Socioeconomic History   Marital status: Married    Spouse name: Not on file   Number of children: Not on file   Years of education: Not on file   Highest education level: Not on file  Occupational History   Not on file  Tobacco Use   Smoking status: Former    Packs/day: 0.50    Years: 10.00    Total pack years: 5.00    Types: Cigarettes    Quit date: 02/04/1998    Years since quitting: 23.7   Smokeless tobacco: Never  Vaping Use   Vaping Use: Never used  Substance and Sexual Activity   Alcohol use: Yes    Comment: occasional beer   Drug use: No   Sexual activity: Not on file  Other Topics Concern   Not on file  Social History Narrative   Not on file   Social Determinants of Health   Financial Resource Strain: Not on file  Food Insecurity: Not on file  Transportation Needs: Not on file  Physical Activity: Not on file  Stress: Not on file  Social Connections: Not on file     FAMILY HISTORY:  We obtained a detailed, 4-generation family history.  Significant diagnoses are listed below: Family History  Problem Relation Age of Onset  Pancreatic cancer Mother 3   Colon cancer Father 72       died 89   Colon polyps Sister    Pancreatic cancer Maternal Grandmother 44   Pancreatic cancer Paternal Grandmother 6   Esophageal cancer Neg Hx    Rectal cancer Neg Hx    Stomach cancer Neg Hx      The patient does not have children.  She has two sisters, one who died at 60.  Both parents are deceased.  The patient's mother died of pancreatic cancer at 34.  She had 8 sisters and one brother who were cancer free.  Her maternal grandmother died of pancreatic cancer.  The patient's father died of colon cancer at 3.  He was an only child.  His mother died of pancreatic cancer.  She had many siblings who had cancer.  Ashlee Beasley is unaware of previous family history of genetic testing for hereditary cancer risks. Patient's maternal ancestors are of  Caucasian descent, and paternal ancestors are of Caucasian descent. There is n reported Ashkenazi Jewish ancestry. There is no known consanguinity.  GENETIC COUNSELING ASSESSMENT: Ashlee Beasley is a 71 y.o. female with a family history of colon and pancreatic cancer which is somewhat suggestive of a hereditary cancer syndrome and predisposition to cancer given the family history of pancreatic cancer. We, therefore, discussed and recommended the following at today's visit.   DISCUSSION: We discussed that, in general, most cancer is not inherited in families, but instead is sporadic or familial. Sporadic cancers occur by chance and typically happen at older ages (>50 years) as this type of cancer is caused by genetic changes acquired during an individual's lifetime. Some families have more cancers than would be expected by chance; however, the ages or types of cancer are not consistent with a known genetic mutation or known genetic mutations have been ruled out. This type of familial cancer is thought to be due to a combination of multiple genetic, environmental, hormonal, and lifestyle factors. While this combination of factors likely increases the risk of cancer, the exact source of this risk is not currently identifiable or testable.  We discussed that up to 15% of pancreatic cancer is hereditary, with most cases associated with BRCA mutations.  There are other genes that can be associated with hereditary pancreatic cancer syndromes.  These include Lynch syndrome, CDKN2A, ATM and PALB2.  We discussed that testing is beneficial for several reasons including knowing how to follow individuals after completing their treatment, identifying whether potential treatment options such as PARP inhibitors would be beneficial, and understand if other family members could be at risk for cancer and allow them to undergo genetic testing.   We reviewed the characteristics, features and inheritance patterns of hereditary cancer  syndromes. We also discussed genetic testing, including the appropriate family members to test, the process of testing, insurance coverage and turn-around-time for results. We discussed the implications of a negative, positive, carrier and/or variant of uncertain significant result. Ashlee Beasley  was offered a common hereditary cancer panel (47 genes) and an expanded pan-cancer panel (77 genes). Ashlee Beasley was informed of the benefits and limitations of each panel, including that expanded pan-cancer panels contain genes that do not have clear management guidelines at this point in time.  We also discussed that as the number of genes included on a panel increases, the chances of variants of uncertain significance increases. Ashlee Beasley decided to pursue genetic testing for the CancerNext-Expanded+RNAinsight gene panel.   The CancerNext-Expanded gene  panel offered by [facility] and includes sequencing and rearrangement analysis for the following 77 genes: AIP, ALK, APC*, ATM*, AXIN2, BAP1, BARD1, BLM, BMPR1A, BRCA1*, BRCA2*, BRIP1*, CDC73, CDH1*, CDK4, CDKN1B, CDKN2A, CHEK2*, CTNNA1, DICER1, FANCC, FH, FLCN, GALNT12, KIF1B, LZTR1, MAX, MEN1, MET, MLH1*, MSH2*, MSH3, MSH6*, MUTYH*, NBN, NF1*, NF2, NTHL1, PALB2*, PHOX2B, PMS2*, POT1, PRKAR1A, PTCH1, PTEN*, RAD51C*, RAD51D*, RB1, RECQL, RET, SDHA, SDHAF2, SDHB, SDHC, SDHD, SMAD4, SMARCA4, SMARCB1, SMARCE1, STK11, SUFU, TMEM127, TP53*, TSC1, TSC2, VHL and XRCC2 (sequencing and deletion/duplication); EGFR, EGLN1, HOXB13, KIT, MITF, PDGFRA, POLD1, and POLE (sequencing only); EPCAM and GREM1 (deletion/duplication only). DNA and RNA analyses performed for * genes.   We discussed that some people do not want to undergo genetic testing due to fear of genetic discrimination.  A federal law called the Genetic Information Non-Discrimination Act (GINA) of 2008 helps protect individuals against genetic discrimination based on their genetic test results.  It impacts both health  insurance and employment.  With health insurance, it protects against increased premiums, being kicked off insurance or being forced to take a test in order to be insured.  For employment it protects against hiring, firing and promoting decisions based on genetic test results.  GINA does not apply to those in the [employer], those who work for companies with less than 15 employees, and new life insurance or long-term disability insurance policies.  Health status due to a cancer diagnosis is not protected under GINA.   Based on Ashlee Beasley's family history of cancer, she meets medical criteria for genetic testing. Despite that she meets criteria, she may still have an out of pocket cost. We discussed that if her out of pocket cost for testing is over $100, the laboratory will call and confirm whether she wants to proceed with testing.  If the out of pocket cost of testing is less than $100 she will be billed by the genetic testing laboratory.   PLAN: After considering the risks, benefits, and limitations, Ashlee Beasley provided informed consent to pursue genetic testing and the blood sample was sent to [facility] for analysis of the CancerNext-Expanded+RNAinsight. Results should be available within approximately 2-3 weeks' time, at which point they will be disclosed by telephone to Ashlee Beasley, as will any additional recommendations warranted by these results. Ashlee Beasley will receive a summary of her genetic counseling visit and a copy of her results once available. This information will also be available in Epic.   Lastly, we encouraged Ashlee Beasley to remain in contact with cancer genetics annually so that we can continuously update the family history and inform her of any changes in cancer genetics and testing that may be of benefit for this family.   Ashlee Beasley questions were answered to her satisfaction today. Our contact information was provided should additional questions or concerns arise. Thank  you for the referral and allowing [place] to share in the care of your patient.   Ernestyne Caldwell P. Fl[place]ne [place], [place], [facility] Licensed, Insurance risk surveyor [email or web address] phone: [phone]  The patient was seen for a total of 40 minutes in face-to-face genetic counseling.  The patient was seen alone.  Drs. Michell Heinrich, and/or [place] were available for questions, if needed..    _______________________________________________________________________ For Office Staff:  Number of people involved in session: 1 Was an Intern/ student involved with case: no

## 2021-11-08 ENCOUNTER — Encounter: Payer: Self-pay | Admitting: Genetic Counselor

## 2021-11-08 ENCOUNTER — Ambulatory Visit: Payer: Self-pay | Admitting: Genetic Counselor

## 2021-11-08 ENCOUNTER — Telehealth: Payer: Self-pay | Admitting: Genetic Counselor

## 2021-11-08 DIAGNOSIS — Z1379 Encounter for other screening for genetic and chromosomal anomalies: Secondary | ICD-10-CM | POA: Insufficient documentation

## 2021-11-08 NOTE — Telephone Encounter (Signed)
Revealed negative genetic testing.  Discussed that we do not know why there is cancer in the family. It could be due to a different gene that we are not testing, or maybe our current technology may not be able to pick something up.  It will be important for her to keep in contact with genetics to keep up with whether additional testing may be needed.   Patient declined referral to pancreatic cancer screening program.

## 2021-11-08 NOTE — Progress Notes (Signed)
HPI:  Ms. Duhamel was previously seen in the [facility] clinic due to a family history of pancreatic cancer and concerns regarding a hereditary predisposition to cancer. Please refer to our prior cancer genetics clinic note for more information regarding our discussion, assessment and recommendations, at the time. Ms. Sliva recent genetic test results were disclosed to her, as were recommendations warranted by these results. These results and recommendations are discussed in more detail below.  CANCER HISTORY:  Oncology History   No history exists.    FAMILY HISTORY:  We obtained a detailed, 4-generation family history.  Significant diagnoses are listed below: Family History  Problem Relation Age of Onset   Pancreatic cancer Mother 3   Colon cancer Father 67       died 57   Colon polyps Sister    Pancreatic cancer Maternal Grandmother 76   Pancreatic cancer Paternal Grandmother 58   Esophageal cancer Neg Hx    Rectal cancer Neg Hx    Stomach cancer Neg Hx        The patient does not have children.  She has two sisters, one who died at 59.  Both parents are deceased.   The patient's mother died of pancreatic cancer at 16.  She had 8 sisters and one brother who were cancer free.  Her maternal grandmother died of pancreatic cancer.   The patient's father died of colon cancer at 61.  He was an only child.  His mother died of pancreatic cancer.  She had many siblings who had cancer.   Ms. Ruland is unaware of previous family history of genetic testing for hereditary cancer risks. Patient's maternal ancestors are of Caucasian descent, and paternal ancestors are of Caucasian descent. There is n reported Ashkenazi Jewish ancestry. There is no known consanguinity  GENETIC TEST RESULTS: Genetic testing reported out on November 07, 2021 through the CancerNext-Expanded+RNAinsight cancer panel found no pathogenic mutations. The CancerNext-Expanded gene panel offered by [facility] and includes sequencing and rearrangement analysis for the following 77 genes: AIP, ALK, APC*, ATM*, AXIN2, BAP1, BARD1, BLM, BMPR1A, BRCA1*, BRCA2*, BRIP1*, CDC73, CDH1*, CDK4, CDKN1B, CDKN2A, CHEK2*, CTNNA1, DICER1, FANCC, FH, FLCN, GALNT12, KIF1B, LZTR1, MAX, MEN1, MET, MLH1*, MSH2*, MSH3, MSH6*, MUTYH*, NBN, NF1*, NF2, NTHL1, PALB2*, PHOX2B, PMS2*, POT1, PRKAR1A, PTCH1, PTEN*, RAD51C*, RAD51D*, RB1, RECQL, RET, SDHA, SDHAF2, SDHB, SDHC, SDHD, SMAD4, SMARCA4, SMARCB1, SMARCE1, STK11, SUFU, TMEM127, TP53*, TSC1, TSC2, VHL and XRCC2 (sequencing and deletion/duplication); EGFR, EGLN1, HOXB13, KIT, MITF, PDGFRA, POLD1, and POLE (sequencing only); EPCAM and GREM1 (deletion/duplication only). DNA and RNA analyses performed for * genes.  The test report has been scanned into EPIC and is located under the Molecular Pathology section of the Results Review tab.  A portion of the result report is included below for reference.     We discussed with Ms. Gauntt that because current genetic testing is not perfect, it is possible there may be a gene mutation in one of these genes that current testing cannot detect, but that chance is small.  We also discussed, that there could be another gene that has not yet been discovered, or that we have not yet tested, that is responsible for the cancer diagnoses in the family. It is also possible there is a hereditary cause for the cancer in the family that Ms. Bors did not inherit and therefore was not identified in her testing.  Therefore, it is important to remain in touch with cancer genetics in the future so that  we can continue to offer Ms. Cardosa the most up to date genetic testing.   ADDITIONAL GENETIC TESTING: We discussed with Ms. Juhasz that her genetic testing was fairly extensive.  If there are genes identified to increase cancer risk that can be analyzed in the future, we would be happy to discuss and coordinate this testing at that time.    CANCER SCREENING  RECOMMENDATIONS: Ms. Kratt test result is considered negative (normal).  This means that we have not identified a hereditary cause for her family history of cancer at this time. Most cancers happen by chance and this negative test suggests that her cancer may fall into this category.    While reassuring, this does not definitively rule out a hereditary predisposition to cancer. It is still possible that there could be genetic mutations that are undetectable by current technology. There could be genetic mutations in genes that have not been tested or identified to increase cancer risk.  Therefore, it is recommended she continue to follow the cancer management and screening guidelines provided by her primary healthcare provider.   An individual's cancer risk and medical management are not determined by genetic test results alone. Overall cancer risk assessment incorporates additional factors, including personal medical history, family history, and any available genetic information that may result in a personalized plan for cancer prevention and surveillance  RECOMMENDATIONS FOR FAMILY MEMBERS:  Individuals in this family might be at some increased risk of developing cancer, over the general population risk, simply due to the family history of cancer.  We recommended women in this family have a yearly mammogram beginning at age 37, or 50 years younger than the earliest onset of cancer, an annual clinical breast exam, and perform monthly breast self-exams. Women in this family should also have a gynecological exam as recommended by their primary provider. All family members should be referred for colonoscopy starting at age 4.  At the original appointment Ms. Rachel indicated she was interested in a pancreatic cancer screening referral.  After today's discussion of her genetic test results, she decided not to pursue that referral.  FOLLOW-UP: Lastly, we discussed with Ms. Glab that cancer genetics is a  rapidly advancing field and it is possible that new genetic tests will be appropriate for her and/or her family members in the future. We encouraged her to remain in contact with cancer genetics on an annual basis so we can update her personal and family histories and let her know of advances in cancer genetics that may benefit this family.   Our contact number was provided. Ms. Kirstein questions were answered to her satisfaction, and she knows she is welcome to call us at anytime with additional questions or concerns.   Roma Kayser, [place], [facility] Licensed, Certified Genetic Counselor [email or web address]

## 2022-01-11 DIAGNOSIS — H2513 Age-related nuclear cataract, bilateral: Secondary | ICD-10-CM | POA: Diagnosis not present

## 2022-01-11 DIAGNOSIS — H18413 Arcus senilis, bilateral: Secondary | ICD-10-CM | POA: Diagnosis not present

## 2022-01-11 DIAGNOSIS — H25043 Posterior subcapsular polar age-related cataract, bilateral: Secondary | ICD-10-CM | POA: Diagnosis not present

## 2022-01-11 DIAGNOSIS — H25013 Cortical age-related cataract, bilateral: Secondary | ICD-10-CM | POA: Diagnosis not present

## 2022-04-05 DIAGNOSIS — H2511 Age-related nuclear cataract, right eye: Secondary | ICD-10-CM | POA: Diagnosis not present

## 2022-04-05 DIAGNOSIS — H2512 Age-related nuclear cataract, left eye: Secondary | ICD-10-CM | POA: Diagnosis not present

## 2022-04-05 HISTORY — PX: OTHER SURGICAL HISTORY: SHX169

## 2022-04-19 DIAGNOSIS — H2512 Age-related nuclear cataract, left eye: Secondary | ICD-10-CM | POA: Diagnosis not present

## 2022-05-21 DIAGNOSIS — Z0000 Encounter for general adult medical examination without abnormal findings: Secondary | ICD-10-CM | POA: Diagnosis not present

## 2022-05-24 DIAGNOSIS — R030 Elevated blood-pressure reading, without diagnosis of hypertension: Secondary | ICD-10-CM | POA: Diagnosis not present

## 2022-05-24 DIAGNOSIS — M8580 Other specified disorders of bone density and structure, unspecified site: Secondary | ICD-10-CM | POA: Diagnosis not present

## 2022-05-24 DIAGNOSIS — Z0000 Encounter for general adult medical examination without abnormal findings: Secondary | ICD-10-CM | POA: Diagnosis not present

## 2022-05-24 DIAGNOSIS — M9111 Juvenile osteochondrosis of head of femur [Legg-Calve-Perthes], right leg: Secondary | ICD-10-CM | POA: Diagnosis not present

## 2022-05-28 DIAGNOSIS — K080 Exfoliation of teeth due to systemic causes: Secondary | ICD-10-CM | POA: Diagnosis not present

## 2022-07-04 DIAGNOSIS — R300 Dysuria: Secondary | ICD-10-CM | POA: Diagnosis not present

## 2022-07-23 DIAGNOSIS — L814 Other melanin hyperpigmentation: Secondary | ICD-10-CM | POA: Diagnosis not present

## 2022-07-23 DIAGNOSIS — L821 Other seborrheic keratosis: Secondary | ICD-10-CM | POA: Diagnosis not present

## 2022-07-23 DIAGNOSIS — D225 Melanocytic nevi of trunk: Secondary | ICD-10-CM | POA: Diagnosis not present

## 2022-07-23 DIAGNOSIS — Z85828 Personal history of other malignant neoplasm of skin: Secondary | ICD-10-CM | POA: Diagnosis not present

## 2022-08-20 DIAGNOSIS — M8580 Other specified disorders of bone density and structure, unspecified site: Secondary | ICD-10-CM | POA: Diagnosis not present

## 2022-08-20 DIAGNOSIS — M8589 Other specified disorders of bone density and structure, multiple sites: Secondary | ICD-10-CM | POA: Diagnosis not present

## 2022-08-21 DIAGNOSIS — Z1231 Encounter for screening mammogram for malignant neoplasm of breast: Secondary | ICD-10-CM | POA: Diagnosis not present

## 2022-09-30 IMAGING — CT CT CARDIAC CORONARY ARTERY CALCIUM SCORE
3 series · 14 of 20 positions shown, 16 images · non-contrast
Comparison: None.

CLINICAL DATA: Family history

EXAM:
CT CARDIAC CORONARY ARTERY CALCIUM SCORE
TECHNIQUE: Non-contrast imaging through the heart was performed using
prospective ECG gating. Image post processing was performed on an
independent workstation, allowing for quantitative analysis of the
heart and coronary arteries. Note that this exam targets the heart
and the chest was not imaged in its entirety.

[Series 2: calcium scoring 2.00 qr36 bestdiast 70% hrt calciu · axial · 0.36mm/px · z∈[+1712,+1784]mm · 4 of 60 slices shown]
[im 12/60  vessel]
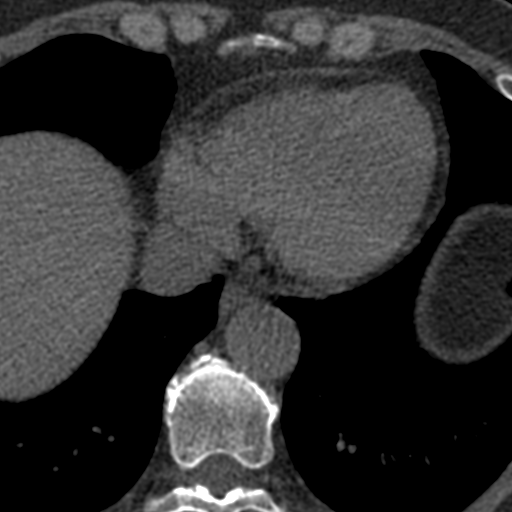
[im 24/60  vessel]
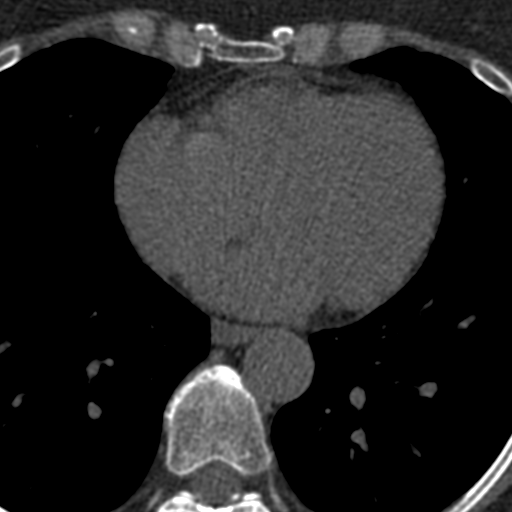
[im 36/60  vessel]
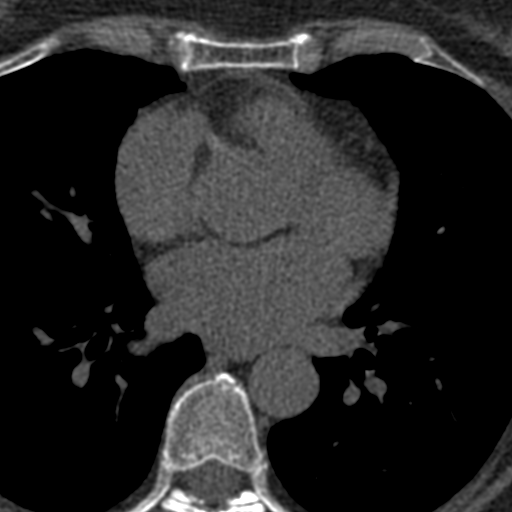
[im 48/60  vessel]
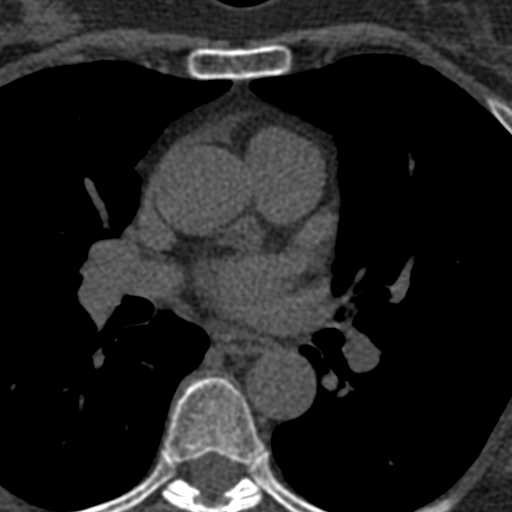

[Series 3: calcium scoring 2.00 br40 bestdiast 70% axial · axial · 0.53mm/px · z∈[+1708,+1788]mm · 5 of 60 slices shown, 7 images]
[im 10/60  vessel]
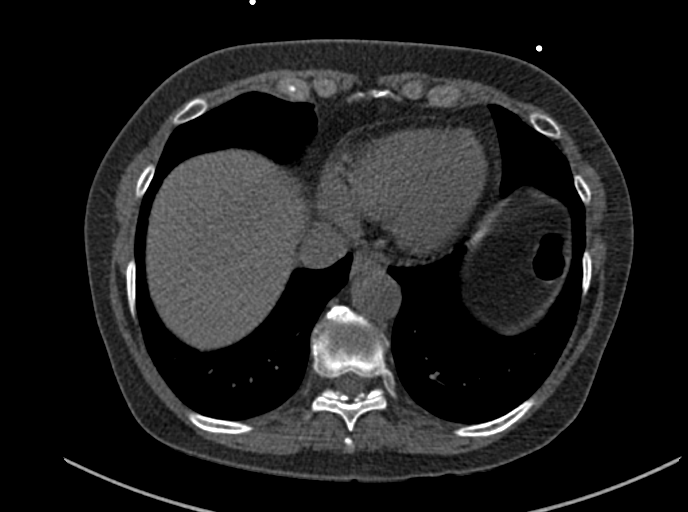
[im 10/60  lung]
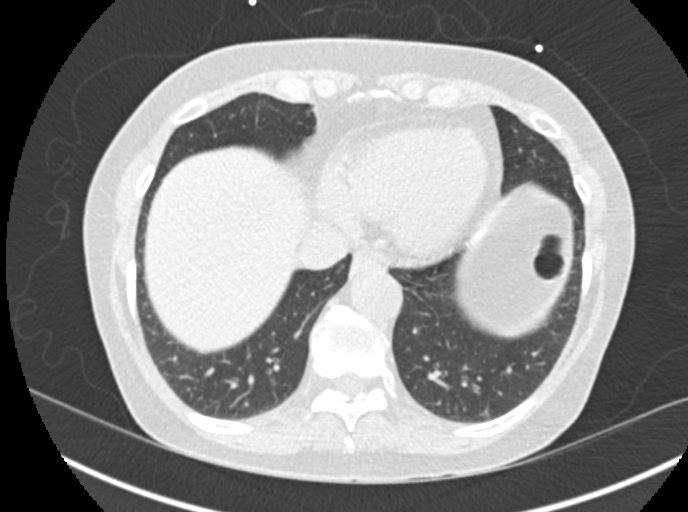
[im 20/60  vessel]
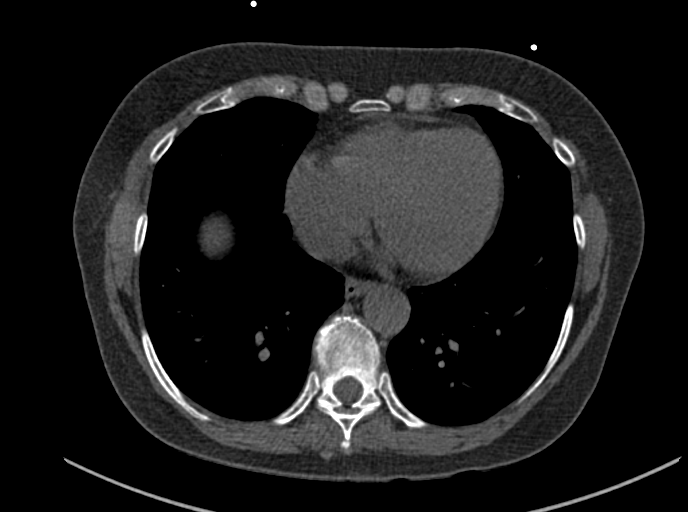
[im 30/60  vessel]
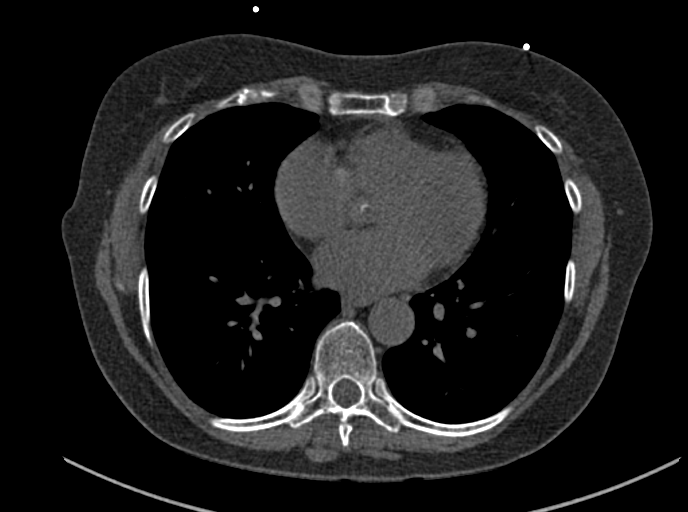
[im 40/60  vessel]
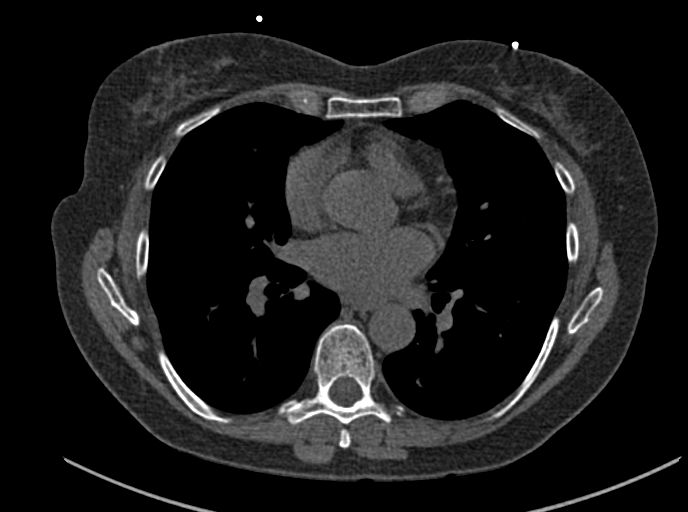
[im 50/60  vessel]
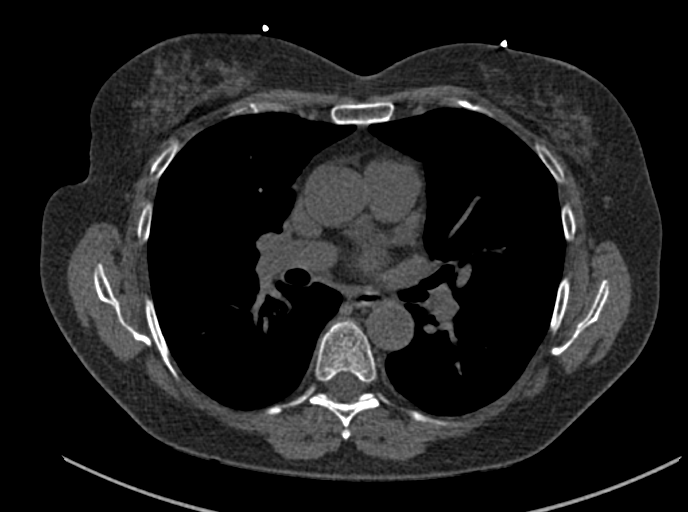
[im 50/60  lung]
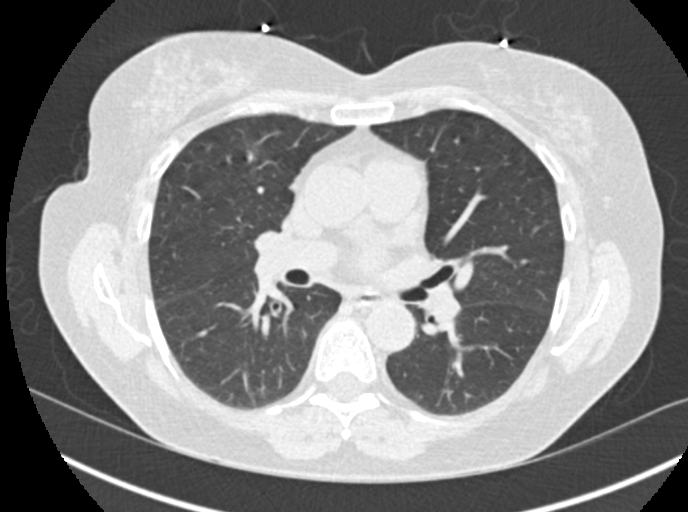

[Series 9: calcium scoring 2.00 br60 bestdiast 70% lungs · axial · 0.53mm/px · z∈[+1708,+1788]mm · 5 of 60 slices shown]
[im 10/60  vessel]
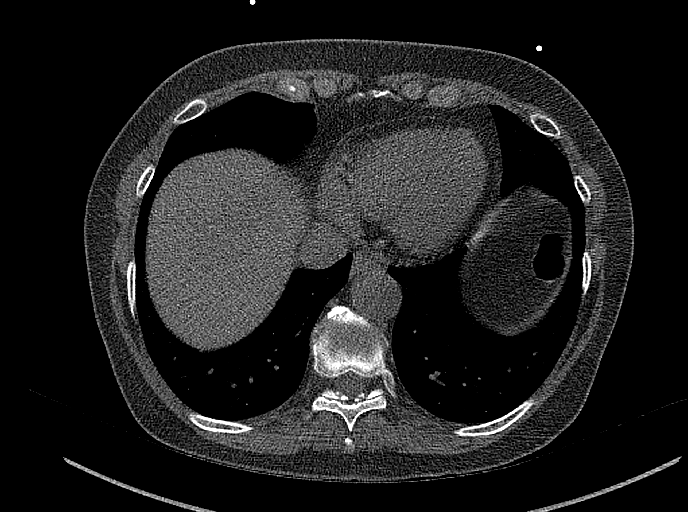
[im 20/60  vessel]
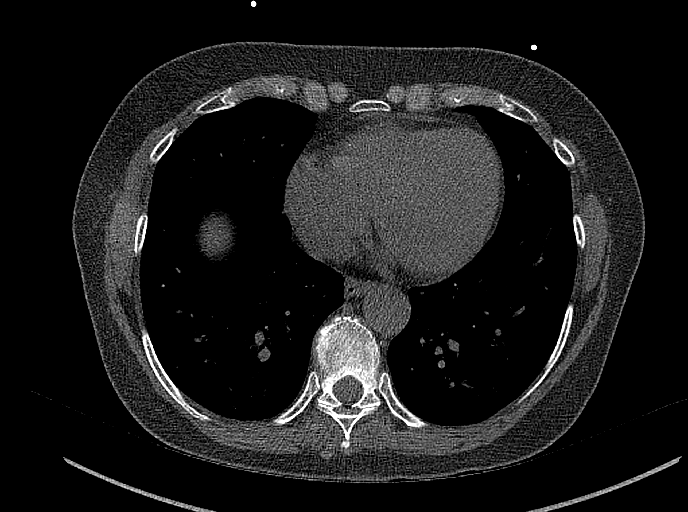
[im 30/60  vessel]
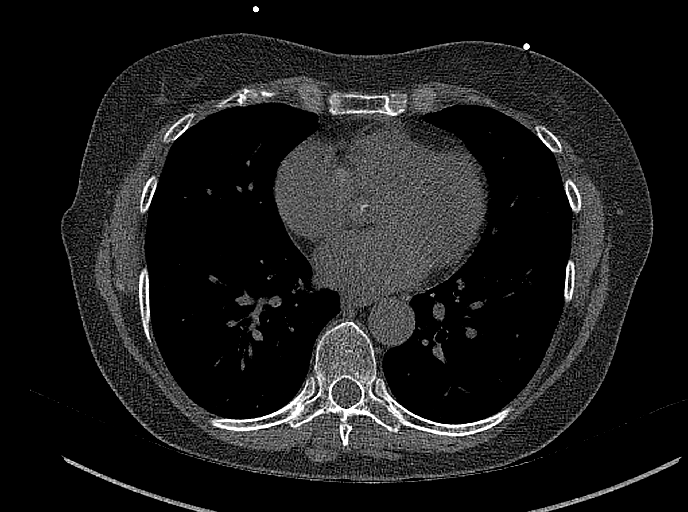
[im 40/60  vessel]
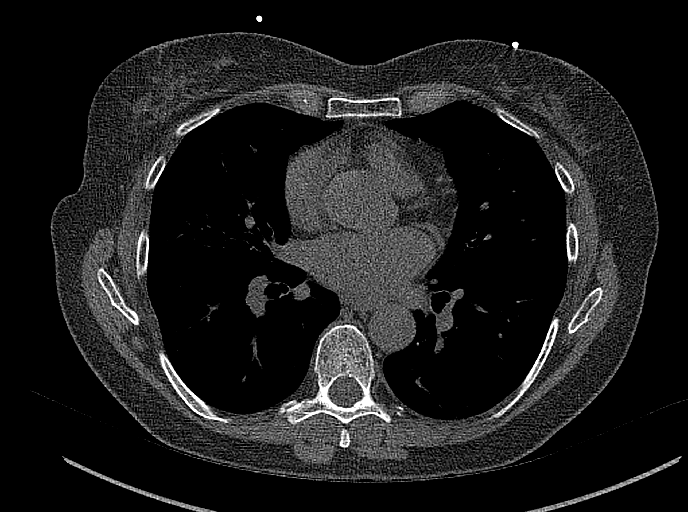
[im 50/60  vessel]
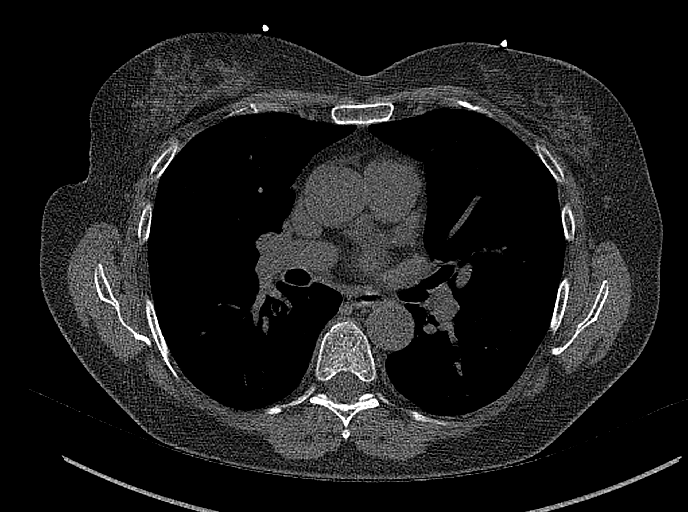

[14 of 20 positions shown; findings below may reference images not displayed]

FINDINGS: CORONARY CALCIUM SCORES:

Left Main: 0

LAD: 0

LCx: 0

RCA: 0

Total Agatston Score: 0

[HOSPITAL] percentile: 0

AORTA MEASUREMENTS:

Ascending Aorta: 31 mm

Descending Aorta: 24 mm

OTHER FINDINGS:

Heart is normal size. Aorta normal caliber. No adenopathy. No
confluent opacities or effusions. Imaging into the upper abdomen
demonstrates no acute findings. Chest wall soft tissues are
unremarkable. No acute bony abnormality.
IMPRESSION: No visible coronary artery calcifications. Total coronary calcium
score of 0.

No acute or significant extracardiac abnormality.

## 2022-12-16 DIAGNOSIS — K080 Exfoliation of teeth due to systemic causes: Secondary | ICD-10-CM | POA: Diagnosis not present

## 2023-01-03 ENCOUNTER — Encounter (HOSPITAL_BASED_OUTPATIENT_CLINIC_OR_DEPARTMENT_OTHER): Payer: Self-pay

## 2023-01-03 ENCOUNTER — Other Ambulatory Visit: Payer: Self-pay

## 2023-01-03 ENCOUNTER — Emergency Department (HOSPITAL_BASED_OUTPATIENT_CLINIC_OR_DEPARTMENT_OTHER)
Admission: EM | Admit: 2023-01-03 | Discharge: 2023-01-03 | Disposition: A | Discharge status: Home / Self Care | Payer: Medicare Other | Attending: Emergency Medicine | Admitting: Emergency Medicine

## 2023-01-03 ENCOUNTER — Emergency Department (HOSPITAL_BASED_OUTPATIENT_CLINIC_OR_DEPARTMENT_OTHER): Payer: Medicare Other | Admitting: Radiology

## 2023-01-03 DIAGNOSIS — Z1152 Encounter for screening for COVID-19: Secondary | ICD-10-CM | POA: Insufficient documentation

## 2023-01-03 DIAGNOSIS — R000 Tachycardia, unspecified: Secondary | ICD-10-CM | POA: Insufficient documentation

## 2023-01-03 DIAGNOSIS — R519 Headache, unspecified: Secondary | ICD-10-CM | POA: Diagnosis not present

## 2023-01-03 DIAGNOSIS — B9789 Other viral agents as the cause of diseases classified elsewhere: Secondary | ICD-10-CM | POA: Diagnosis not present

## 2023-01-03 DIAGNOSIS — R059 Cough, unspecified: Secondary | ICD-10-CM | POA: Diagnosis not present

## 2023-01-03 DIAGNOSIS — J069 Acute upper respiratory infection, unspecified: Secondary | ICD-10-CM | POA: Diagnosis not present

## 2023-01-03 LAB — RESP PANEL BY RT-PCR (RSV, FLU A&B, COVID)  RVPGX2
Influenza A by PCR: NEGATIVE
Influenza B by PCR: NEGATIVE
Resp Syncytial Virus by PCR: NEGATIVE
SARS Coronavirus 2 by RT PCR: NEGATIVE

## 2023-01-03 MED ORDER — BENZONATATE 100 MG PO CAPS: 100.0000 mg | ORAL_CAPSULE | Freq: Three times a day (TID) | ORAL | 0 refills | Status: AC

## 2023-01-03 MED ORDER — PREDNISONE 20 MG PO TABS: 40.0000 mg | ORAL_TABLET | Freq: Every day | ORAL | 0 refills | Status: AC

## 2023-01-03 NOTE — Discharge Instructions (Addendum)
Try using the benzocaine lozenges over-the-counter for the cough and sore throat as well as over-the-counter dextromethorphan.  You are also given a prescription for Tessalon Perles that might also help with your cough.  You can also try saline nasal spray or doing a steam bath and breathing in the warm vapors.  You were given a prescription for prednisone but I would only fill that if symptoms do not start improving in the next 3 to 4 days and you feel like you are having more tightness or wheezing develops.  Return to the emergency room if you start developing high fevers, inability to catch her breath, return of the vomiting or chest pain

## 2023-01-03 NOTE — ED Provider Notes (Signed)
[facility] AT [facility] Provider Note   CSN: [id number] Arrival date & time: 01/03/23  4696     History  Chief Complaint  Patient presents with   Cough    Ashlee BURGO is a 72 y.o. female.  Patient is a 72 year old female with a history of seizures years ago that do not require any medications but otherwise healthy presenting today with complaints of ongoing URI symptoms.  Approximately 8 days ago she stated it started with a terrible headache and 12 hours of vomiting.  The vomiting only lasted 12 hours the next day she reported several more days of headache but then feeling nasal congestion which has gradually progressed.  She reports now she is having more drainage in her throat and a persistent sore throat but her nose is starting to feel little more open but now she feels like it is sitting in her chest.  For the last 3 to 4 days she has had more of a cough.  The cough is nonproductive and she denies shortness of breath or wheezing.  She has no history of lung problems and does not use inhalers.  She denies tobacco use.  She has been using over-the-counter medications including DayQuil, NyQuil, Tylenol.  No noted fever.  She has started eating and drinking again and feels that she has been able to hydrate.  The history is provided by the patient.  Cough      Home Medications Prior to Admission medications   Medication Sig Start Date End Date Taking? Authorizing Provider  benzonatate (TESSALON) 100 MG capsule Take 1 capsule (100 mg total) by mouth every 8 (eight) hours. 01/03/23  Yes Slayde Brault, Alphonzo Lemmings, MD  predniSONE (DELTASONE) 20 MG tablet Take 2 tablets (40 mg total) by mouth daily. 01/03/23  Yes Gwyneth Sprout, MD  acetaminophen (TYLENOL) 325 MG tablet Take 650 mg by mouth every 6 (six) hours as needed.    [provider]  calcium citrate-vitamin D (CITRACAL+D) 315-200 MG-UNIT per tablet Take 1 tablet by mouth 2 (two) times daily.     [provider]      Allergies    Phenobarbital    Review of Systems   Review of Systems  Respiratory:  Positive for cough.     Physical Exam Updated Vital Signs BP (!) 178/93 (BP Location: Right Arm)   Pulse (!) 105   Temp 98.2 F (36.8 C) (Oral)   Resp 18   Ht 5\' 4"  (1.626 m)   Wt 64.9 kg   SpO2 100%   BMI 24.55 kg/m  Physical Exam Vitals and nursing note reviewed.  Constitutional:      General: She is not in acute distress.    Appearance: She is well-developed.  HENT:     Head: Normocephalic and atraumatic.     Right Ear: Tympanic membrane normal.     Left Ear: Tympanic membrane normal.     Mouth/Throat:     Mouth: Mucous membranes are moist.     Pharynx: Uvula midline. Posterior oropharyngeal erythema present. No pharyngeal swelling or oropharyngeal exudate.  Eyes:     Pupils: Pupils are equal, round, and reactive to light.  Cardiovascular:     Rate and Rhythm: Regular rhythm. Tachycardia present.     Heart sounds: Normal heart sounds. No murmur heard.    No friction rub.  Pulmonary:     Effort: Pulmonary effort is normal.     Breath sounds: Examination of the right-lower field reveals rhonchi.  Rhonchi present. No wheezing or rales.  Abdominal:     General: Bowel sounds are normal. There is no distension.     Palpations: Abdomen is soft.     Tenderness: There is no abdominal tenderness. There is no guarding or rebound.  Musculoskeletal:        General: No tenderness. Normal range of motion.     Comments: No edema  Skin:    General: Skin is warm and dry.     Findings: No rash.  Neurological:     Mental Status: She is alert and oriented to person, place, and time.     Cranial Nerves: No cranial nerve deficit.  Psychiatric:        Behavior: Behavior normal.     ED Results / Procedures / Treatments   Labs (all labs ordered are listed, but only abnormal results are displayed) Labs Reviewed  RESP PANEL BY RT-PCR (RSV, FLU A&B, COVID)  [id number]     EKG None  Radiology DG Chest 2 View  Result Date: 01/03/2023 CLINICAL DATA:  72 year old female with history of cough. Sore throat. Headache. EXAM: CHEST - 2 VIEW COMPARISON:  Chest x-ray 11/25/2020. FINDINGS: Lung volumes are normal. No consolidative airspace disease. No pleural effusions. No pneumothorax. No pulmonary nodule or mass noted. Pulmonary vasculature and the cardiomediastinal silhouette are within normal limits. IMPRESSION: No radiographic evidence of acute cardiopulmonary disease. Electronically Signed   By: Trudie Reed M.D.   On: 01/03/2023 07:36    Procedures Procedures    Medications Ordered in ED Medications - No data to display  ED Course/ Medical Decision Making/ A&P                                 Medical Decision Making Amount and/or Complexity of Data Reviewed Radiology: ordered and independent interpretation performed. Decision-making details documented in ED Course.   72 year old female without significant past medical history presenting with URI symptoms.  Sounds typical of viral illness however concern for possible development of pneumonia versus persistent viral symptoms and bronchitis.  No findings to suggest strep pharyngitis, peritonsillar abscess, epiglottitis or retropharyngeal abscess.  Low suspicion for PE or abdominal pathology.  Patient is satting 100% on room air.  She is noted to be hypertensive today and minimally tachycardic.  Suspect hypertension is most likely related to over-the-counter medications she has been using since her illness.  Tachycardia improves with rest.  X-ray to further evaluate and rule out pneumonia pending.  8:14 AM I have independently visualized and interpreted pt's images today.  Chest x-ray within normal limits today.  Suspect ongoing viral process with postviral cough.  Patient given Jerilynn Som will have her continue cough benzocaine lozenges and dextromethorphan over-the-counter.  Discussed the findings  with the patient.  She is comfortable with this plan.         Final Clinical Impression(s) / ED Diagnoses Final diagnoses:  Viral URI with cough    Rx / DC Orders ED Discharge Orders          Ordered    benzonatate (TESSALON) 100 MG capsule  Every 8 hours        01/03/23 0812    predniSONE (DELTASONE) 20 MG tablet  Daily        01/03/23 0813              Gwyneth Sprout, MD 01/03/23 [phone]

## 2023-01-03 NOTE — ED Notes (Signed)

## 2023-01-03 NOTE — ED Triage Notes (Signed)
Pt complaining of cough, sore throat, headache, that started on Thursday but is getting worse instead of better.

## 2023-03-12 DIAGNOSIS — K080 Exfoliation of teeth due to systemic causes: Secondary | ICD-10-CM | POA: Diagnosis not present

## 2023-03-28 IMAGING — DX DG CHEST 1V PORT
1 series · 1 of 1 positions shown · non-contrast
Comparison: 05/30/2020 CT

CLINICAL DATA: Cough for 10 days.

EXAM:
PORTABLE CHEST 1 VIEW

[chest]
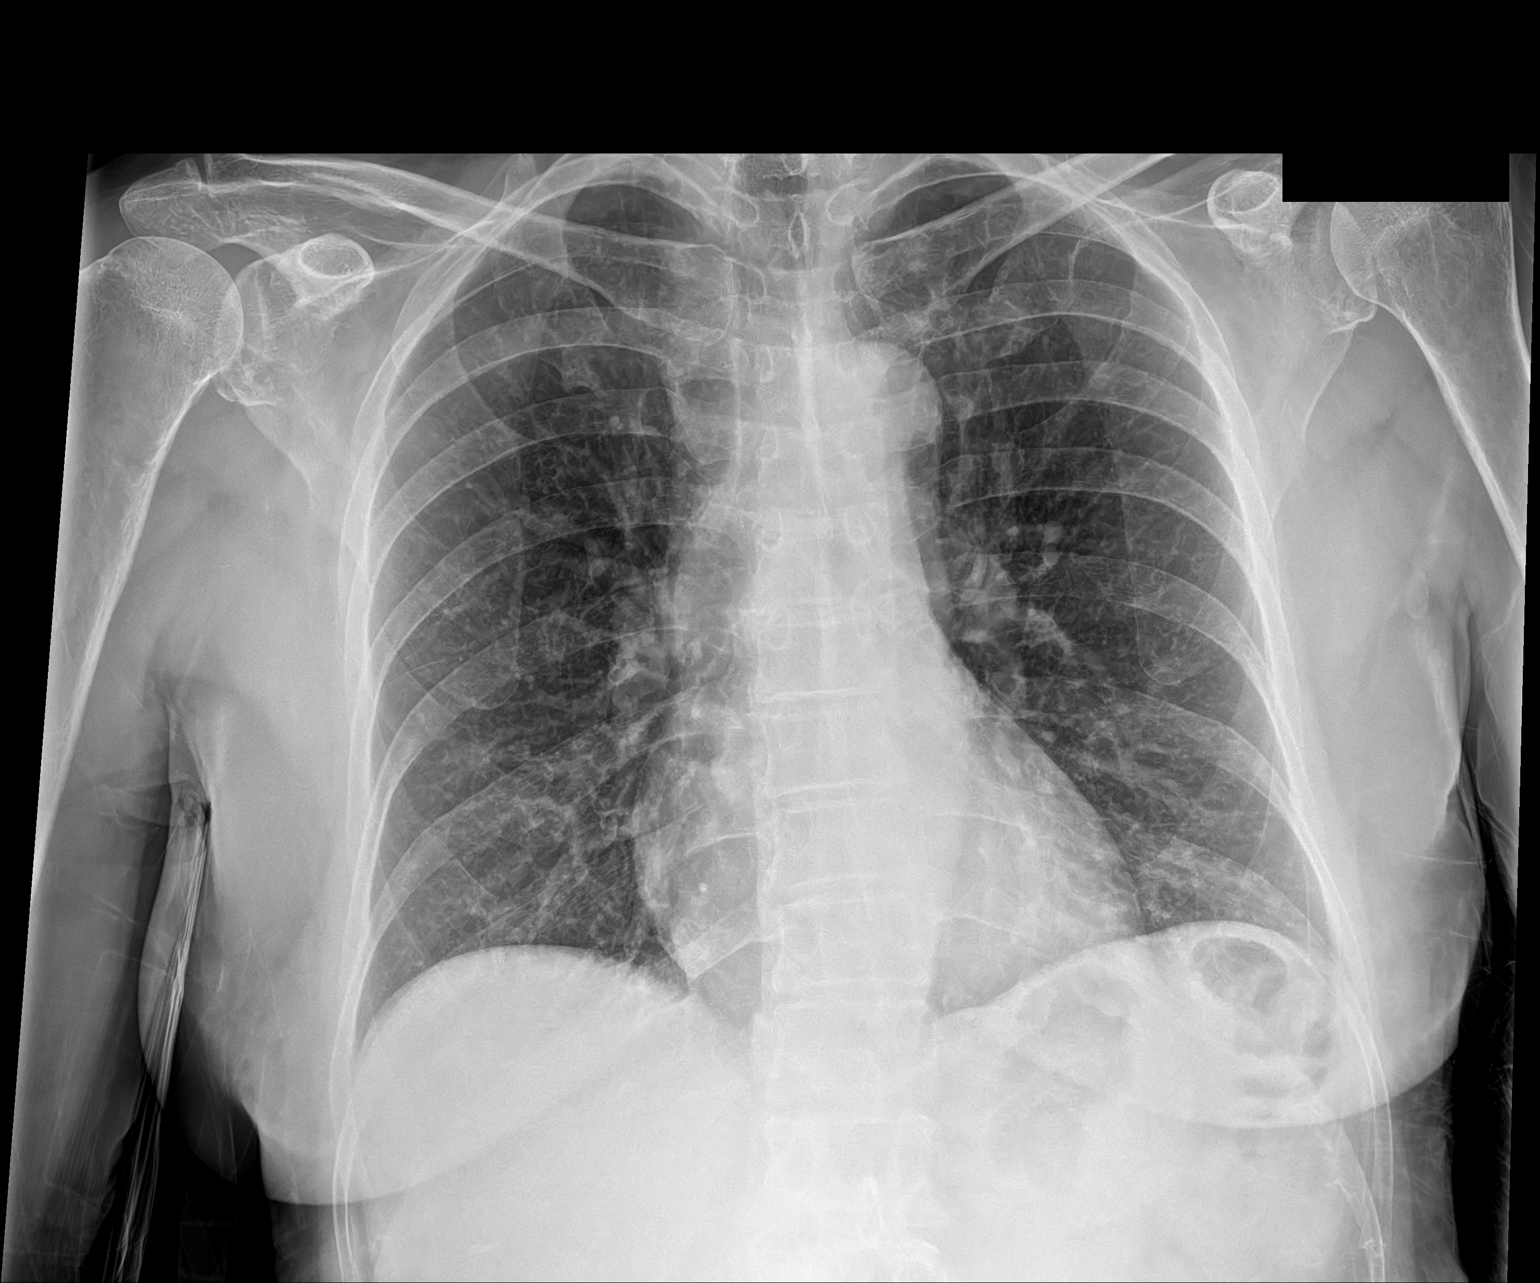

[1 of 1 positions shown; findings below may reference images not displayed]

FINDINGS: Heart size is normal. There is mild perihilar peribronchial
thickening. No focal consolidations or effusions. No pulmonary
edema.
IMPRESSION: Mild bronchitic changes.

## 2023-05-22 DIAGNOSIS — R030 Elevated blood-pressure reading, without diagnosis of hypertension: Secondary | ICD-10-CM | POA: Diagnosis not present

## 2023-05-28 DIAGNOSIS — R030 Elevated blood-pressure reading, without diagnosis of hypertension: Secondary | ICD-10-CM | POA: Diagnosis not present

## 2023-05-28 DIAGNOSIS — M9111 Juvenile osteochondrosis of head of femur [Legg-Calve-Perthes], right leg: Secondary | ICD-10-CM | POA: Diagnosis not present

## 2023-05-28 DIAGNOSIS — R3129 Other microscopic hematuria: Secondary | ICD-10-CM | POA: Diagnosis not present

## 2023-05-28 DIAGNOSIS — Z0000 Encounter for general adult medical examination without abnormal findings: Secondary | ICD-10-CM | POA: Diagnosis not present

## 2023-05-28 DIAGNOSIS — M8580 Other specified disorders of bone density and structure, unspecified site: Secondary | ICD-10-CM | POA: Diagnosis not present

## 2023-06-04 DIAGNOSIS — Z961 Presence of intraocular lens: Secondary | ICD-10-CM | POA: Diagnosis not present

## 2023-06-17 DIAGNOSIS — M7989 Other specified soft tissue disorders: Secondary | ICD-10-CM | POA: Diagnosis not present

## 2023-06-17 DIAGNOSIS — I87393 Chronic venous hypertension (idiopathic) with other complications of bilateral lower extremity: Secondary | ICD-10-CM | POA: Diagnosis not present

## 2023-06-17 DIAGNOSIS — I872 Venous insufficiency (chronic) (peripheral): Secondary | ICD-10-CM | POA: Diagnosis not present

## 2023-07-02 ENCOUNTER — Encounter: Payer: Self-pay | Admitting: Internal Medicine

## 2023-07-21 DIAGNOSIS — K080 Exfoliation of teeth due to systemic causes: Secondary | ICD-10-CM | POA: Diagnosis not present

## 2023-07-23 DIAGNOSIS — Z5189 Encounter for other specified aftercare: Secondary | ICD-10-CM | POA: Diagnosis not present

## 2023-08-06 ENCOUNTER — Encounter: Payer: Self-pay | Admitting: Internal Medicine

## 2023-08-06 ENCOUNTER — Ambulatory Visit (AMBULATORY_SURGERY_CENTER)

## 2023-08-06 VITALS — Ht 64.0 in | Wt 145.0 lb

## 2023-08-06 DIAGNOSIS — Z860100 Personal history of colon polyps, unspecified: Secondary | ICD-10-CM

## 2023-08-06 DIAGNOSIS — Z800 Family history of malignant neoplasm of digestive organs: Secondary | ICD-10-CM

## 2023-08-06 MED ORDER — SUTAB 1479-225-188 MG PO TABS
ORAL_TABLET | ORAL | 0 refills | Status: DC
Start: 2023-08-06 — End: 2023-08-20

## 2023-08-06 MED ORDER — ONDANSETRON HCL 4 MG PO TABS: 4.0000 mg | ORAL_TABLET | Freq: Three times a day (TID) | ORAL | 1 refills | Status: AC | PRN

## 2023-08-06 NOTE — Progress Notes (Signed)
 No egg or soy allergy known to patient  No issues known to pt with past sedation with any surgeries or procedures Patient denies ever being told they had issues or difficulty with intubation  No FH of Malignant Hyperthermia Pt is not on diet pills nor GLP-1 medications Pt is not on  home 02  Pt is not on blood thinners  Pt denies issues with chronic constipation  No A fib or A flutter Have any cardiac testing pending--no Pt instructed to use Single[employer] or GoodRx for a price reduction on prep  Ambulates independently

## 2023-08-18 ENCOUNTER — Telehealth: Payer: Self-pay | Admitting: Internal Medicine

## 2023-08-18 NOTE — Telephone Encounter (Signed)
 Returned patient call and confirmed that zofran  and ondansetron  are the same medication.  Patient to take one 30 minutes prior to starting bowel prep the night before and morning of colonoscopy.

## 2023-08-18 NOTE — Telephone Encounter (Signed)
 Patient needing to be further advised on prep instructions. Please advise.   Thank you

## 2023-08-20 ENCOUNTER — Ambulatory Visit (AMBULATORY_SURGERY_CENTER): Admitting: Internal Medicine

## 2023-08-20 ENCOUNTER — Encounter: Payer: Self-pay | Admitting: Internal Medicine

## 2023-08-20 VITALS — BP 119/66 | HR 70 | Temp 96.8°F | Resp 14 | Ht 64.0 in | Wt 145.0 lb

## 2023-08-20 DIAGNOSIS — D122 Benign neoplasm of ascending colon: Secondary | ICD-10-CM | POA: Diagnosis not present

## 2023-08-20 DIAGNOSIS — Z1211 Encounter for screening for malignant neoplasm of colon: Secondary | ICD-10-CM

## 2023-08-20 DIAGNOSIS — K621 Rectal polyp: Secondary | ICD-10-CM

## 2023-08-20 DIAGNOSIS — Z800 Family history of malignant neoplasm of digestive organs: Secondary | ICD-10-CM

## 2023-08-20 DIAGNOSIS — Z860100 Personal history of colon polyps, unspecified: Secondary | ICD-10-CM

## 2023-08-20 DIAGNOSIS — D128 Benign neoplasm of rectum: Secondary | ICD-10-CM

## 2023-08-20 MED ORDER — SODIUM CHLORIDE 0.9 % IV SOLN: 500.0000 mL | Freq: Once | INTRAVENOUS | Status: DC

## 2023-08-20 NOTE — Progress Notes (Signed)
 Sedate, gd SR, tolerated procedure well, VSS, report to RN

## 2023-08-20 NOTE — Progress Notes (Signed)
 Pt's states no medical or surgical changes since previsit or office visit.

## 2023-08-20 NOTE — Op Note (Signed)
 [facility] Patient Name: Ashlee Beasley Procedure Date: 08/20/2023 9:59 AM MRN: [id number] Endoscopist: Norleen SAILOR. Abran , MD, [id number] Age: 73 Referring MD:  Date of Birth: 03-22-1950 Gender: Female Account #: [id number] Procedure:                Colonoscopy with cold snare polypectomy x 2; biopsy                            polypectomy x 1 Indications:              High risk colon cancer surveillance: Personal                            history of multiple (3 or more) adenomas. Previous                            examinations 2004 (elsewhere) and 2022 Medicines:                Monitored Anesthesia Care Procedure:                Pre-Anesthesia Assessment:                           - Prior to the procedure, a History and Physical                            was performed, and patient medications and                            allergies were reviewed. The patient's tolerance of                            previous anesthesia was also reviewed. The risks                            and benefits of the procedure and the sedation                            options and risks were discussed with the patient.                            All questions were answered, and informed consent                            was obtained. Prior Anticoagulants: The patient has                            taken no anticoagulant or antiplatelet agents. ASA                            Grade Assessment: II - A patient with mild systemic                            disease. After reviewing the risks and benefits,  the patient was deemed in satisfactory condition to                            undergo the procedure.                           After obtaining informed consent, the colonoscope                            was passed under direct vision. Throughout the                            procedure, the patient's blood pressure, pulse, and                            oxygen saturations  were monitored continuously. The                            CF [id number] #[id number] was introduced through the anus                            and advanced to the the cecum, identified by                            appendiceal orifice and ileocecal valve. The                            ileocecal valve, appendiceal orifice, and rectum                            were photographed. The quality of the bowel                            preparation was excellent. The colonoscopy was                            performed without difficulty. The patient tolerated                            the procedure well. The bowel preparation used was                            SUPREP via split dose instruction. Scope In: 10:03:31 AM Scope Out: 10:18:21 AM Scope Withdrawal Time: 0 hours 12 minutes 54 seconds  Total Procedure Duration: 0 hours 14 minutes 50 seconds  Findings:                 A 1 mm polyp was found in the rectum. The polyp was                            removed with a jumbo cold forceps. Resection and                            retrieval were complete.  Two polyps were found in the ascending colon. The                            polyps were 3 to 7 mm in size. These polyps were                            removed with a cold snare. Resection and retrieval                            were complete.                           The exam was otherwise without abnormality on                            direct and retroflexion views. Complications:            No immediate complications. Estimated blood loss:                            None. Estimated Blood Loss:     Estimated blood loss: none. Impression:               - One 1 mm polyp in the rectum, removed with a                            jumbo cold forceps. Resected and retrieved.                           - Two 3 to 7 mm polyps in the ascending colon,                            removed with a cold snare. Resected and retrieved.                            - The examination was otherwise normal on direct                            and retroflexion views. Recommendation:           - Repeat colonoscopy in 5 years for surveillance.                           - Patient has a contact number available for                            emergencies. The signs and symptoms of potential                            delayed complications were discussed with the                            patient. Return to normal activities tomorrow.  Written discharge instructions were provided to the                            patient.                           - Resume previous diet.                           - Continue present medications.                           - Await pathology results. Norleen SAILOR. Abran, MD 08/20/2023 10:28:19 AM This report has been signed electronically.

## 2023-08-20 NOTE — Progress Notes (Signed)
 HISTORY OF PRESENT ILLNESS:  Ashlee Beasley is a 73 y.o. female with a history of multiple adenomatous colon polyps.  Presents today for surveillance colonoscopy.  No complaints  REVIEW OF SYSTEMS:  All non-GI ROS negative except for  Past Medical History:  Diagnosis Date   Arthritis    oa   Complication of anesthesia    pt denies for herself.  only family history with her mother   Family history of anesthesia complication    mother n/v, blood clot x 1  few daysd after surgery   Family history of colon cancer in father    Family history of pancreatic cancer    Seizures (HCC) 1978 or 1979    had 2 seizures, no meds since 1982   Tension headache     Past Surgical History:  Procedure Laterality Date   ABDOMINAL HYSTERECTOMY  02/04/1989   Cataract Surgery  04/2022   COLONOSCOPY     colonscopy  over 10 yrs ago   TOTAL HIP ARTHROPLASTY Right 04/20/2013   Procedure: RIGHT TOTAL HIP ARTHROPLASTY ANTERIOR APPROACH;  Surgeon: Donnice JONETTA Car, MD;  Location: [facility];  Service: Orthopedics;  Laterality: Right;    Social History Ashlee Beasley  reports that she quit smoking about 25 years ago. Her smoking use included cigarettes. She started smoking about 35 years ago. She has a 5 pack-year smoking history. She has never used smokeless tobacco. She reports current alcohol use. She reports that she does not use drugs.  family history includes Colon cancer (age of onset: 80) in her father; Colon polyps in her sister; Pancreatic cancer (age of onset: 42) in her maternal grandmother and paternal grandmother; Pancreatic cancer (age of onset: 42) in her mother.  Allergies  Allergen Reactions   Phenobarbital Rash and Dermatitis       PHYSICAL EXAMINATION: Vital signs: BP (!) 151/95   Pulse 66   Temp (!) 96.8 F (36 C)   Resp (!) 0   Ht 5' 4 (1.626 m)   Wt 145 lb (65.8 kg)   SpO2 97%   BMI 24.89 kg/m  General: Well-developed, well-nourished, no acute distress HEENT: Sclerae are  anicteric, conjunctiva pink. Oral mucosa intact Lungs: Clear Heart: Regular Abdomen: soft, nontender, nondistended, no obvious ascites, no peritoneal signs, normal bowel sounds. No organomegaly. Extremities: No edema Psychiatric: alert and oriented x3. Cooperative     ASSESSMENT:  History of multiple adenomatous polyps   PLAN:   Surveillance colonoscopy

## 2023-08-20 NOTE — Progress Notes (Signed)
 Called to room to assist during endoscopic procedure.  Patient ID and intended procedure confirmed with present staff. Received instructions for my participation in the procedure from the performing physician.

## 2023-08-20 NOTE — Patient Instructions (Signed)
 YOU HAD AN ENDOSCOPIC PROCEDURE TODAY AT THE [facility]:   Refer to the procedure report that was given to you for any specific questions about what was found during the examination.  If the procedure report does not answer your questions, please call your gastroenterologist to clarify.  If you requested that your care partner not be given the details of your procedure findings, then the procedure report has been included in a sealed envelope for you to review at your convenience later.  YOU SHOULD EXPECT: Some feelings of bloating in the abdomen. Passage of more gas than usual.  Walking can help get rid of the air that was put into your GI tract during the procedure and reduce the bloating. If you had a lower endoscopy (such as a colonoscopy or flexible sigmoidoscopy) you may notice spotting of blood in your stool or on the toilet paper. If you underwent a bowel prep for your procedure, you may not have a normal bowel movement for a few days.  Please Note:  You might notice some irritation and congestion in your nose or some drainage.  This is from the oxygen used during your procedure.  There is no need for concern and it should clear up in a day or so.  SYMPTOMS TO REPORT IMMEDIATELY:  Following lower endoscopy (colonoscopy or flexible sigmoidoscopy):  Excessive amounts of blood in the stool  Significant tenderness or worsening of abdominal pains  Swelling of the abdomen that is new, acute  Fever of 100F or higher  Continue present medications Resume previous diet Await pathology results Handout on polyps given   For urgent or emergent issues, a gastroenterologist can be reached at any hour by calling [phone]. Do not use MyChart messaging for urgent concerns.    DIET:  We do recommend a small meal at first, but then you may proceed to your regular diet.  Drink plenty of fluids but you should avoid alcoholic beverages for 24 hours.  ACTIVITY:  You should plan to take  it easy for the rest of today and you should NOT DRIVE or use heavy machinery until tomorrow (because of the sedation medicines used during the test).    FOLLOW UP: Our staff will call the number listed on your records the next business day following your procedure.  We will call around 7:15- 8:00 am to check on you and address any questions or concerns that you may have regarding the information given to you following your procedure. If we do not reach you, we will leave a message.     If any biopsies were taken you will be contacted by phone or by letter within the next 1-3 weeks.  Please call us  at [phone] if you have not heard about the biopsies in 3 weeks.    SIGNATURES/CONFIDENTIALITY: You and/or your care partner have signed paperwork which will be entered into your electronic medical record.  These signatures attest to the fact that that the information above on your After Visit Summary has been reviewed and is understood.  Full responsibility of the confidentiality of this discharge information lies with you and/or your care-partner.

## 2023-08-21 ENCOUNTER — Telehealth: Payer: Self-pay

## 2023-08-21 NOTE — Telephone Encounter (Signed)
 Left message on follow up call.

## 2023-08-22 LAB — SURGICAL PATHOLOGY

## 2023-08-23 ENCOUNTER — Ambulatory Visit: Payer: Self-pay | Admitting: Internal Medicine

## 2023-08-27 DIAGNOSIS — Z1231 Encounter for screening mammogram for malignant neoplasm of breast: Secondary | ICD-10-CM | POA: Diagnosis not present

## 2023-11-26 DIAGNOSIS — K080 Exfoliation of teeth due to systemic causes: Secondary | ICD-10-CM | POA: Diagnosis not present
